# Patient Record
Sex: Female | Born: 1985 | Race: Black or African American | Hispanic: No | Marital: Single | Smoking: Never smoker
Health system: Southern US, Community
[De-identification: ages and names within clinical notes are randomized; demographics above are authoritative.]

## PROBLEM LIST (undated history)

## (undated) DIAGNOSIS — F32A Depression, unspecified: Secondary | ICD-10-CM

## (undated) DIAGNOSIS — F419 Anxiety disorder, unspecified: Secondary | ICD-10-CM

## (undated) DIAGNOSIS — I10 Essential (primary) hypertension: Secondary | ICD-10-CM

## (undated) DIAGNOSIS — I8290 Acute embolism and thrombosis of unspecified vein: Secondary | ICD-10-CM

## (undated) DIAGNOSIS — E669 Obesity, unspecified: Secondary | ICD-10-CM

## (undated) DIAGNOSIS — R7303 Prediabetes: Secondary | ICD-10-CM

## (undated) HISTORY — DX: Essential (primary) hypertension: I10

## (undated) HISTORY — DX: Obesity, unspecified: E66.9

## (undated) HISTORY — DX: Acute embolism and thrombosis of unspecified vein: I82.90

## (undated) HISTORY — DX: Depression, unspecified: F32.A

## (undated) HISTORY — DX: Anxiety disorder, unspecified: F41.9

---

## 2017-06-13 ENCOUNTER — Emergency Department (HOSPITAL_COMMUNITY)
Admission: EM | Admit: 2017-06-13 | Discharge: 2017-06-13 | Disposition: A | Discharge status: Home / Self Care | Payer: Medicaid Other | Attending: Emergency Medicine | Admitting: Emergency Medicine

## 2017-06-13 ENCOUNTER — Other Ambulatory Visit: Payer: Self-pay

## 2017-06-13 ENCOUNTER — Encounter (HOSPITAL_COMMUNITY): Payer: Self-pay

## 2017-06-13 DIAGNOSIS — Z5321 Procedure and treatment not carried out due to patient leaving prior to being seen by health care provider: Secondary | ICD-10-CM | POA: Insufficient documentation

## 2017-06-13 DIAGNOSIS — R200 Anesthesia of skin: Secondary | ICD-10-CM | POA: Insufficient documentation

## 2017-06-13 NOTE — ED Triage Notes (Signed)
Onset yesterday right hand numbness that is constant with intermittant shooting pains up right arm.

## 2017-06-13 NOTE — ED Notes (Signed)
Pt's name called x2 to update vitals with no answer

## 2017-09-17 ENCOUNTER — Emergency Department (HOSPITAL_COMMUNITY)
Admission: EM | Admit: 2017-09-17 | Discharge: 2017-09-17 | Disposition: A | Discharge status: Home / Self Care | Payer: Medicaid Other | Attending: Emergency Medicine | Admitting: Emergency Medicine

## 2017-09-17 ENCOUNTER — Other Ambulatory Visit: Payer: Self-pay

## 2017-09-17 ENCOUNTER — Encounter (HOSPITAL_COMMUNITY): Payer: Self-pay

## 2017-09-17 DIAGNOSIS — Y929 Unspecified place or not applicable: Secondary | ICD-10-CM | POA: Insufficient documentation

## 2017-09-17 DIAGNOSIS — S3141XA Laceration without foreign body of vagina and vulva, initial encounter: Secondary | ICD-10-CM | POA: Diagnosis present

## 2017-09-17 DIAGNOSIS — X58XXXA Exposure to other specified factors, initial encounter: Secondary | ICD-10-CM | POA: Insufficient documentation

## 2017-09-17 DIAGNOSIS — Y939 Activity, unspecified: Secondary | ICD-10-CM | POA: Insufficient documentation

## 2017-09-17 DIAGNOSIS — Y999 Unspecified external cause status: Secondary | ICD-10-CM | POA: Insufficient documentation

## 2017-09-17 LAB — I-STAT BETA HCG BLOOD, ED (MC, WL, AP ONLY)

## 2017-09-17 MED ORDER — LIDOCAINE-EPINEPHRINE (PF) 2 %-1:200000 IJ SOLN
INTRAMUSCULAR | Status: AC
  Administered 2017-09-17: 20 mL
  Filled 2017-09-17: qty 20

## 2017-09-17 MED ORDER — LIDOCAINE-EPINEPHRINE (PF) 2 %-1:200000 IJ SOLN
20.0000 mL | Freq: Once | INTRAMUSCULAR | Status: AC
  Administered 2017-09-17: 20 mL

## 2017-09-17 NOTE — ED Triage Notes (Signed)
States was having intercourse pta to ER and vaginal bleeding noted no pain voiced.

## 2017-09-17 NOTE — ED Provider Notes (Signed)
[facility] DEPT Provider Note   CSN: [id number] Arrival date & time: 09/17/17  0334     History   Chief Complaint Chief Complaint  Patient presents with  . Vaginal Bleeding    HPI Candice Holder is a 32 y.o. female.  HPI Patient presents to the emergency department with complaints of vaginal bleeding that occurred while having intercourse this evening at approximately 3 AM.  She denies significant pain during intercourse.  She denies abdominal pain.  She states the bleeding has since nearly resolved.  Denies nausea vomiting.  No dysuria or urinary frequency.  No other complaints at this time.  Last normal menstrual cycle was 2 weeks ago.  History reviewed. No pertinent past medical history.  There are no active problems to display for this patient.   Past Surgical History:  Procedure Laterality Date  . CESAREAN SECTION       OB History   None      Home Medications    Prior to Admission medications   Not on File    Family History History reviewed. No pertinent family history.  Social History Social History   Tobacco Use  . Smoking status: Never Smoker  . Smokeless tobacco: Never Used  Substance Use Topics  . Alcohol use: Yes  . Drug use: No     Allergies   Iodine   Review of Systems Review of Systems  All other systems reviewed and are negative.    Physical Exam Updated Vital Signs BP (!) 144/94 (BP Location: Right Arm)   Pulse (!) 105   Temp 98.3 F (36.8 C) (Oral)   Resp 20   Ht 5\' 4"  (1.626 m)   Wt 118.8 kg (262 lb)   SpO2 99%   BMI 44.97 kg/m   Physical Exam  Constitutional: She is oriented to person, place, and time. She appears well-developed and well-nourished.  HENT:  Head: Normocephalic.  Eyes: EOM are normal.  Neck: Normal range of motion.  Pulmonary/Chest: Effort normal.  Abdominal: She exhibits no distension.  Genitourinary:  Genitourinary Comments: Chaperone present.  3 cm vaginal  laceration noted to the right anterior lateral vaginal wall without active bleeding  Musculoskeletal: Normal range of motion.  Neurological: She is alert and oriented to person, place, and time.  Psychiatric: She has a normal mood and affect.  Nursing note and vitals reviewed.    ED Treatments / Results  Labs (all labs ordered are listed, but only abnormal results are displayed) Labs Reviewed  I-STAT BETA HCG BLOOD, ED ([facility], [facility], [facility] ONLY)    EKG None  Radiology No results found.  Procedures .Marland KitchenLaceration Repair Performed by: Jola Schmidt, MD Authorized by: Jola Schmidt, MD    LACERATION REPAIR Performed by: Jola Schmidt Consent: Verbal consent obtained. Risks and benefits: risks, benefits and alternatives were discussed Patient identity confirmed: provided demographic data Time out performed prior to procedure Prepped and Draped in normal sterile fashion Wound explored Laceration Location: Right vaginal introitus Laceration Length: 3 cm No Foreign Bodies seen or palpated Anesthesia: local infiltration Local anesthetic: lidocaine 2 % with epinephrine Anesthetic total: 4 ml Irrigation method: syringe Amount of cleaning: standard Skin closure: 3-0 chromic Number of sutures or staples: 3 Technique: Simple interrupted Patient tolerance: Patient tolerated the procedure well with no immediate complications.   Medications Ordered in ED Medications  lidocaine-EPINEPHrine (XYLOCAINE W/EPI) 2 %-1:200000 (PF) injection 20 mL (20 mLs Infiltration Given by Other 09/17/17 5638)     Initial Impression / Assessment  and Plan / ED Course  I have reviewed the triage vital signs and the nursing notes.  Pertinent labs & imaging results that were available during my care of the patient were reviewed by me and considered in my medical decision making (see chart for details).     Vaginal laceration without active bleeding at this time.  Sutured with 3 stitches.  Pelvic rest.  Sitz  bath recommended.  Otherwise overall well-appearing.  Discharged home in good condition.  Abdominal exam is without tenderness.  Final Clinical Impressions(s) / ED Diagnoses   Final diagnoses:  Vaginal laceration, initial encounter    ED Discharge Orders    None       Jola Schmidt, MD 09/17/17 1718

## 2017-11-12 ENCOUNTER — Emergency Department (HOSPITAL_COMMUNITY): Payer: No Typology Code available for payment source

## 2017-11-12 ENCOUNTER — Emergency Department (HOSPITAL_COMMUNITY)
Admission: EM | Admit: 2017-11-12 | Discharge: 2017-11-12 | Disposition: A | Discharge status: Home / Self Care | Payer: No Typology Code available for payment source | Attending: Emergency Medicine | Admitting: Emergency Medicine

## 2017-11-12 ENCOUNTER — Encounter (HOSPITAL_COMMUNITY): Payer: Self-pay | Admitting: Emergency Medicine

## 2017-11-12 ENCOUNTER — Other Ambulatory Visit: Payer: Self-pay

## 2017-11-12 DIAGNOSIS — Y92410 Unspecified street and highway as the place of occurrence of the external cause: Secondary | ICD-10-CM | POA: Diagnosis not present

## 2017-11-12 DIAGNOSIS — F10929 Alcohol use, unspecified with intoxication, unspecified: Secondary | ICD-10-CM | POA: Insufficient documentation

## 2017-11-12 DIAGNOSIS — R4182 Altered mental status, unspecified: Secondary | ICD-10-CM | POA: Diagnosis present

## 2017-11-12 DIAGNOSIS — Y999 Unspecified external cause status: Secondary | ICD-10-CM | POA: Diagnosis not present

## 2017-11-12 DIAGNOSIS — Y9389 Activity, other specified: Secondary | ICD-10-CM | POA: Insufficient documentation

## 2017-11-12 DIAGNOSIS — S27321A Contusion of lung, unilateral, initial encounter: Secondary | ICD-10-CM | POA: Diagnosis not present

## 2017-11-12 LAB — CBC
HCT: 39.6 % (ref 36.0–46.0)
HEMOGLOBIN: 12.9 g/dL (ref 12.0–15.0)
MCH: 29.3 pg (ref 26.0–34.0)
MCHC: 32.6 g/dL (ref 30.0–36.0)
MCV: 90 fL (ref 78.0–100.0)
Platelets: 374 10*3/uL (ref 150–400)
RBC: 4.4 MIL/uL (ref 3.87–5.11)
RDW: 14.1 % (ref 11.5–15.5)
WBC: 8.1 10*3/uL (ref 4.0–10.5)

## 2017-11-12 LAB — COMPREHENSIVE METABOLIC PANEL
ALBUMIN: 3.9 g/dL (ref 3.5–5.0)
ALK PHOS: 50 U/L (ref 38–126)
ALT: 13 U/L — ABNORMAL LOW (ref 14–54)
ANION GAP: 13 (ref 5–15)
AST: 18 U/L (ref 15–41)
BUN: 6 mg/dL (ref 6–20)
CHLORIDE: 111 mmol/L (ref 101–111)
CO2: 19 mmol/L — AB (ref 22–32)
Calcium: 9.1 mg/dL (ref 8.9–10.3)
Creatinine, Ser: 0.68 mg/dL (ref 0.44–1.00)
GFR calc Af Amer: >60 mL/min (ref >60)
GFR calc non Af Amer: >60 mL/min (ref >60)
GLUCOSE: 104 mg/dL — AB (ref 65–99)
Potassium: 3 mmol/L — ABNORMAL LOW (ref 3.5–5.1)
SODIUM: 143 mmol/L (ref 135–145)
Total Bilirubin: 0.4 mg/dL (ref 0.3–1.2)
Total Protein: 6.9 g/dL (ref 6.5–8.1)

## 2017-11-12 LAB — ETHANOL: ALCOHOL ETHYL (B): 265 mg/dL — AB (ref <10)

## 2017-11-12 LAB — I-STAT BETA HCG BLOOD, ED (MC, WL, AP ONLY): I-stat hCG, quantitative: <5 m[IU]/mL (ref <5)

## 2017-11-12 MED ORDER — CYCLOBENZAPRINE HCL 10 MG PO TABS: 10.0000 mg | ORAL_TABLET | Freq: Three times a day (TID) | ORAL | 0 refills | Status: DC | PRN

## 2017-11-12 MED ORDER — IBUPROFEN 800 MG PO TABS: 800.0000 mg | ORAL_TABLET | Freq: Three times a day (TID) | ORAL | 0 refills | Status: DC

## 2017-11-12 NOTE — ED Notes (Signed)
Patient transported to CT 
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
## 2017-11-12 NOTE — ED Notes (Signed)
C-Collar removed per PA Pollina

## 2017-11-12 NOTE — ED Notes (Signed)
Officer Dimas Millin ID # [id number] [place] [employer] here to collect legal blood draw. This RN witnessed pt given her rights at 63. Pt gave consent for blood draw at 0150. Site prepped chlorhexidine solution due to iodine allergy. Kit # [id number], expiration date February 10 2022. Blood drawn at 0159 and samples give to officer Dimas Millin.
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
## 2018-03-26 ENCOUNTER — Ambulatory Visit (INDEPENDENT_AMBULATORY_CARE_PROVIDER_SITE_OTHER): Payer: Self-pay

## 2018-03-26 ENCOUNTER — Ambulatory Visit (HOSPITAL_COMMUNITY)
Admission: EM | Admit: 2018-03-26 | Discharge: 2018-03-26 | Disposition: A | Discharge status: Home / Self Care | Payer: Self-pay | Attending: Family Medicine | Admitting: Family Medicine

## 2018-03-26 ENCOUNTER — Encounter (HOSPITAL_COMMUNITY): Payer: Self-pay | Admitting: Emergency Medicine

## 2018-03-26 DIAGNOSIS — R0789 Other chest pain: Secondary | ICD-10-CM

## 2018-03-26 DIAGNOSIS — R51 Headache: Secondary | ICD-10-CM

## 2018-03-26 MED ORDER — NAPROXEN 500 MG PO TABS: 500.0000 mg | ORAL_TABLET | Freq: Two times a day (BID) | ORAL | 0 refills | Status: DC

## 2018-03-26 NOTE — ED Triage Notes (Signed)
Pt sts bilateral chest and rib pain worse with inspiration

## 2018-03-26 NOTE — Discharge Instructions (Signed)
EKG and Chest Xray normal  Please take naprosyn twice daily with food or Use anti-inflammatories for pain/swelling. You may take up to 800 mg Ibuprofen every 8 hours with food. You may supplement Ibuprofen with Tylenol [id number] mg every 8 hours.   Ice to chest and arm  Follow up if chest pain worsening changing, developing shortness of breath, dizziness, leg pain/swelling, nausea, vomiting,

## 2018-03-26 NOTE — ED Provider Notes (Signed)
[place]    CSN: [id number] Arrival date & time: 03/26/18  1309     History   Chief Complaint Chief Complaint  Patient presents with  . rib pain    HPI Markelle Asaro is a 32 y.o. female no significant past medical history presenting today for evaluation of chest pain, rib pain and left arm pain.  Patient states that on Friday she started to develop chest and rib pain.  Worsens with inspiration.  Feels a pressure sensation as if somebody sitting on top of her chest.  Slight improvement with leaning forward.  Will occasionally get sharp discomfort which started over the weekend and left arm pain that started this morning.  She denies associated cough, fever or shortness of breath.  Denies associated nausea or vomiting.  Eating and drinking like normal.  Seems unrelated to mealtimes.  Denies leg pain or leg swelling.  Patient does note she had a previous blood clot/DVT during pregnancy.  Denies OCP use.  Denies smoking history.  Denies recent travel, immobilization, hospitalization or surgery.  Denies any increase in activity.  Patient does note that she does a lot of lifting at her work as she works for the [employer].  Denies any specific injury, fall or trauma to the side of her chest.  Normal bowel movements.  Denies personal history of hypertension, hyperlipidemia, diabetes.  Patient's mother passed away from MI at 88 and grandmother at 74.  HPI  History reviewed. No pertinent past medical history.  There are no active problems to display for this patient.   Past Surgical History:  Procedure Laterality Date  . CESAREAN SECTION      OB History   None      Home Medications    Prior to Admission medications   Medication Sig Start Date End Date Taking? Authorizing Provider  ibuprofen (ADVIL,MOTRIN) 800 MG tablet Take 1 tablet (800 mg total) by mouth 3 (three) times daily. 11/12/17   Orpah Greek, MD  naproxen (NAPROSYN) 500 MG tablet Take 1 tablet (500  mg total) by mouth 2 (two) times daily. 03/26/18   , Elesa Hacker, PA-C    Family History History reviewed. No pertinent family history.  Social History Social History   Tobacco Use  . Smoking status: Never Smoker  . Smokeless tobacco: Never Used  Substance Use Topics  . Alcohol use: Yes  . Drug use: No     Allergies   Iodine   Review of Systems Review of Systems  Constitutional: Negative for fatigue and fever.  HENT: Negative for congestion, sinus pressure and sore throat.   Eyes: Negative for photophobia, pain and visual disturbance.  Respiratory: Negative for cough and shortness of breath.   Cardiovascular: Positive for chest pain. Negative for leg swelling.  Gastrointestinal: Negative for abdominal pain, nausea and vomiting.  Genitourinary: Negative for decreased urine volume and hematuria.  Musculoskeletal: Positive for back pain and myalgias. Negative for neck pain and neck stiffness.  Neurological: Positive for headaches. Negative for dizziness, syncope, facial asymmetry, speech difficulty, weakness, light-headedness and numbness.     Physical Exam Triage Vital Signs ED Triage Vitals [03/26/18 1400]  Enc Vitals Group     BP (!) 142/88     Pulse Rate (!) 57     Resp 18     Temp 98.4 F (36.9 C)     Temp Source Oral     SpO2 100 %     Weight      Height  Head Circumference      Peak Flow      Pain Score 7     Pain Loc      Pain Edu?      Excl. in [place]?    No data found.  Updated Vital Signs BP (!) 142/88 (BP Location: Left Arm)   Pulse (!) 57   Temp 98.4 F (36.9 C) (Oral)   Resp 18   SpO2 100%   Visual Acuity Right Eye Distance:   Left Eye Distance:   Bilateral Distance:    Right Eye Near:   Left Eye Near:    Bilateral Near:     Physical Exam  Constitutional: She appears well-developed and well-nourished. No distress.  HENT:  Head: Normocephalic and atraumatic.  Oral mucosa pink and moist, no tonsillar enlargement or exudate.  Posterior pharynx patent and nonerythematous, no uvula deviation or swelling. Normal phonation.  Eyes: Pupils are equal, round, and reactive to light. Conjunctivae and EOM are normal.  Neck: Normal range of motion. Neck supple.  Cardiovascular: Normal rate and regular rhythm.  No murmur heard. Pulmonary/Chest: Effort normal and breath sounds normal. No respiratory distress. She exhibits tenderness.  Breathing comfortably at rest, CTABL, no wheezing, rales or other adventitious sounds auscultated  Anterior chest tender to palpation throughout bilateral chest diffusely throughout, tenderness does extend on left side to left flank/mid axillary line into left thoracic back  Abdominal: Soft. There is tenderness.  Mild tenderness throughout entire abdomen, no focal tenderness, negative rebound, negative Murphy's, negative McBurney  Musculoskeletal: She exhibits no edema.  Neurological: She is alert.  Skin: Skin is warm and dry.  Psychiatric: She has a normal mood and affect.  Nursing note and vitals reviewed.    UC Treatments / Results  Labs (all labs ordered are listed, but only abnormal results are displayed) Labs Reviewed - No data to display  EKG None  Radiology Dg Chest 2 View  Result Date: 03/26/2018 CLINICAL DATA:  Chest pain left-sided EXAM: CHEST - 2 VIEW COMPARISON:  CT chest 11/12/2017 FINDINGS: Heart size upper normal. Negative for heart failure or edema. No infiltrate or effusion. Negative for mass lesion. IMPRESSION: No active cardiopulmonary disease. Electronically Signed   By: Franchot Gallo M.D.   On: 03/26/2018 14:35    Procedures Procedures (including critical care time)  Medications Ordered in UC Medications - No data to display  Initial Impression / Assessment and Plan / UC Course  I have reviewed the triage vital signs and the nursing notes.  Pertinent labs & imaging results that were available during my care of the patient were reviewed by me and considered  in my medical decision making (see chart for details).     Chest x-ray negative, EKG normal sinus rhythm, no acute signs of ischemia or infarction.  Given chest discomfort reproducible, likely musculoskeletal especially given nature of job.  Naprosyn twice daily for discomfort.  Discussed with patient that EKG does not definitively rule out cardiac origin of chest discomfort given her family history, but overall negative risk factors.  Patient is not tachycardic, short of breath, or 100%, PE less likely.  Advised patient if she has worsening chest discomfort, developing shortness of breath, dizziness, to go to emergency room for further work-up of her symptoms.Discussed strict return precautions. Patient verbalized understanding and is agreeable with plan.  Final Clinical Impressions(s) / UC Diagnoses   Final diagnoses:  Chest wall pain     Discharge Instructions     EKG and Chest  Xray normal  Please take naprosyn twice daily with food or Use anti-inflammatories for pain/swelling. You may take up to 800 mg Ibuprofen every 8 hours with food. You may supplement Ibuprofen with Tylenol [phone] mg every 8 hours.   Ice to chest and arm  Follow up if chest pain worsening changing, developing shortness of breath, dizziness, leg pain/swelling, nausea, vomiting,     ED Prescriptions    Medication Sig Dispense Auth. Provider   naproxen (NAPROSYN) 500 MG tablet Take 1 tablet (500 mg total) by mouth 2 (two) times daily. 30 tablet , [place] C, PA-C     Controlled Substance Prescriptions [place] Controlled Substance Registry consulted? Not Applicable   Janith Lima, [place] 03/26/18 1458

## 2018-05-13 ENCOUNTER — Other Ambulatory Visit: Payer: Self-pay

## 2018-05-13 ENCOUNTER — Ambulatory Visit (HOSPITAL_COMMUNITY)
Admission: EM | Admit: 2018-05-13 | Discharge: 2018-05-13 | Disposition: A | Discharge status: Home / Self Care | Payer: Self-pay | Attending: Family Medicine | Admitting: Family Medicine

## 2018-05-13 ENCOUNTER — Encounter (HOSPITAL_COMMUNITY): Payer: Self-pay | Admitting: Emergency Medicine

## 2018-05-13 ENCOUNTER — Ambulatory Visit (INDEPENDENT_AMBULATORY_CARE_PROVIDER_SITE_OTHER): Payer: Self-pay

## 2018-05-13 DIAGNOSIS — S20219A Contusion of unspecified front wall of thorax, initial encounter: Secondary | ICD-10-CM

## 2018-05-13 MED ORDER — CYCLOBENZAPRINE HCL 10 MG PO TABS: 10.0000 mg | ORAL_TABLET | Freq: Two times a day (BID) | ORAL | 0 refills | Status: DC | PRN

## 2018-05-13 MED ORDER — IBUPROFEN 800 MG PO TABS: 800.0000 mg | ORAL_TABLET | Freq: Three times a day (TID) | ORAL | 0 refills | Status: DC

## 2018-05-13 NOTE — ED Triage Notes (Signed)
The patient presented to the [facility] with a complaint of sternal and left side rib cage pain secondary to a fall that occurred last night. The patient stated that she missed a step and fell.
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
## 2018-05-13 NOTE — Discharge Instructions (Signed)
NO fracture seen in chest  Use anti-inflammatories for pain/swelling. You may take up to 800 mg Ibuprofen every 8 hours with food. You may supplement Ibuprofen with Tylenol [phone] every 8 hours.   You may use flexeril as needed to help with pain. This is a muscle relaxer and causes sedation- please use only at bedtime or when you will be home and not have to drive/work  Please continue to monitor your symptoms and follow up if symptoms not gradually resolving over next 1-2 weeks, return sooner if developing worsening shortness of breath, different chest pain, changes in vision, numbness or tingling, weakness

## 2018-10-09 ENCOUNTER — Emergency Department (HOSPITAL_COMMUNITY)
Admission: EM | Admit: 2018-10-09 | Discharge: 2018-10-09 | Disposition: A | Discharge status: Home / Self Care | Payer: Self-pay | Attending: Emergency Medicine | Admitting: Emergency Medicine

## 2018-10-09 ENCOUNTER — Encounter (HOSPITAL_COMMUNITY): Payer: Self-pay | Admitting: *Deleted

## 2018-10-09 DIAGNOSIS — Y939 Activity, unspecified: Secondary | ICD-10-CM | POA: Insufficient documentation

## 2018-10-09 DIAGNOSIS — X58XXXA Exposure to other specified factors, initial encounter: Secondary | ICD-10-CM | POA: Insufficient documentation

## 2018-10-09 DIAGNOSIS — Y999 Unspecified external cause status: Secondary | ICD-10-CM | POA: Insufficient documentation

## 2018-10-09 DIAGNOSIS — Y929 Unspecified place or not applicable: Secondary | ICD-10-CM | POA: Insufficient documentation

## 2018-10-09 DIAGNOSIS — T192XXA Foreign body in vulva and vagina, initial encounter: Secondary | ICD-10-CM | POA: Insufficient documentation

## 2018-10-09 DIAGNOSIS — T7421XA Adult sexual abuse, confirmed, initial encounter: Secondary | ICD-10-CM | POA: Insufficient documentation

## 2018-10-09 LAB — CBC WITH DIFFERENTIAL/PLATELET
Abs Immature Granulocytes: 0.03 10*3/uL (ref 0.00–0.07)
Basophils Absolute: 0.1 10*3/uL (ref 0.0–0.1)
Basophils Relative: 1 %
Eosinophils Absolute: 0.1 10*3/uL (ref 0.0–0.5)
Eosinophils Relative: 1 %
HCT: 37.6 % (ref 36.0–46.0)
Hemoglobin: 12.4 g/dL (ref 12.0–15.0)
Immature Granulocytes: 0 %
Lymphocytes Relative: 28 %
Lymphs Abs: 2.5 10*3/uL (ref 0.7–4.0)
MCH: 30.4 pg (ref 26.0–34.0)
MCHC: 33 g/dL (ref 30.0–36.0)
MCV: 92.2 fL (ref 80.0–100.0)
Monocytes Absolute: 0.4 10*3/uL (ref 0.1–1.0)
Monocytes Relative: 5 %
Neutro Abs: 6 10*3/uL (ref 1.7–7.7)
Neutrophils Relative %: 65 %
Platelets: 463 10*3/uL — ABNORMAL HIGH (ref 150–400)
RBC: 4.08 MIL/uL (ref 3.87–5.11)
RDW: 13.2 % (ref 11.5–15.5)
WBC: 9.1 10*3/uL (ref 4.0–10.5)
nRBC: 0 % (ref 0.0–0.2)

## 2018-10-09 LAB — URINALYSIS, ROUTINE W REFLEX MICROSCOPIC
Bacteria, UA: NONE SEEN
Bilirubin Urine: NEGATIVE
Glucose, UA: NEGATIVE mg/dL
Ketones, ur: NEGATIVE mg/dL
Nitrite: NEGATIVE
Protein, ur: NEGATIVE mg/dL
Specific Gravity, Urine: 1.016 (ref 1.005–1.030)
pH: 6 (ref 5.0–8.0)

## 2018-10-09 LAB — BASIC METABOLIC PANEL
Anion gap: 10 (ref 5–15)
BUN: 9 mg/dL (ref 6–20)
CO2: 24 mmol/L (ref 22–32)
Calcium: 9.4 mg/dL (ref 8.9–10.3)
Chloride: 106 mmol/L (ref 98–111)
Creatinine, Ser: 0.65 mg/dL (ref 0.44–1.00)
GFR calc Af Amer: >60 mL/min (ref >60)
GFR calc non Af Amer: >60 mL/min (ref >60)
Glucose, Bld: 104 mg/dL — ABNORMAL HIGH (ref 70–99)
Potassium: 3.7 mmol/L (ref 3.5–5.1)
Sodium: 140 mmol/L (ref 135–145)

## 2018-10-09 LAB — POC URINE PREG, ED: Preg Test, Ur: NEGATIVE

## 2018-10-09 LAB — WET PREP, GENITAL
Sperm: NONE SEEN
Trich, Wet Prep: NONE SEEN
Yeast Wet Prep HPF POC: NONE SEEN

## 2018-10-09 MED ORDER — METRONIDAZOLE 500 MG PO TABS
2000.0000 mg | ORAL_TABLET | Freq: Once | ORAL | Status: AC
  Administered 2018-10-09: 2000 mg via ORAL
  Filled 2018-10-09: qty 4

## 2018-10-09 MED ORDER — LIDOCAINE HCL (PF) 1 % IJ SOLN: 0.9000 mL | Freq: Once | INTRAMUSCULAR | Status: DC

## 2018-10-09 MED ORDER — AZITHROMYCIN 250 MG PO TABS
1000.0000 mg | ORAL_TABLET | Freq: Once | ORAL | Status: AC
  Administered 2018-10-09: 1000 mg via ORAL
  Filled 2018-10-09: qty 4

## 2018-10-09 MED ORDER — CEFTRIAXONE SODIUM 250 MG IJ SOLR
250.0000 mg | Freq: Once | INTRAMUSCULAR | Status: AC
  Administered 2018-10-09: 250 mg via INTRAMUSCULAR
  Filled 2018-10-09: qty 250

## 2018-10-09 MED ORDER — ULIPRISTAL ACETATE 30 MG PO TABS
30.0000 mg | ORAL_TABLET | Freq: Once | ORAL | Status: AC
  Administered 2018-10-09: 30 mg via ORAL
  Filled 2018-10-09: qty 1

## 2018-10-09 NOTE — Discharge Instructions (Addendum)
Please read attached information. If you experience any new or worsening signs or symptoms please return to the emergency room for evaluation. Please follow-up with your primary care provider or specialist as discussed.  °
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
## 2018-10-09 NOTE — ED Notes (Signed)
Patient verbalizes understanding of discharge instructions. Opportunity for questioning and answers were provided. Armband removed by staff, pt discharged from ED. Ambulated out to lobby  
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
## 2018-10-10 LAB — HIV ANTIBODY (ROUTINE TESTING W REFLEX): HIV Screen 4th Generation wRfx: NONREACTIVE

## 2018-10-10 LAB — GC/CHLAMYDIA PROBE AMP (~~LOC~~) NOT AT ARMC
Chlamydia: NEGATIVE
Neisseria Gonorrhea: NEGATIVE

## 2019-10-06 ENCOUNTER — Emergency Department (HOSPITAL_COMMUNITY): Payer: Medicaid Other

## 2019-10-06 ENCOUNTER — Emergency Department (HOSPITAL_COMMUNITY)
Admission: EM | Admit: 2019-10-06 | Discharge: 2019-10-06 | Disposition: A | Discharge status: Home / Self Care | Payer: Medicaid Other | Attending: Emergency Medicine | Admitting: Emergency Medicine

## 2019-10-06 ENCOUNTER — Other Ambulatory Visit: Payer: Self-pay

## 2019-10-06 DIAGNOSIS — T148XXA Other injury of unspecified body region, initial encounter: Secondary | ICD-10-CM

## 2019-10-06 DIAGNOSIS — Z23 Encounter for immunization: Secondary | ICD-10-CM | POA: Insufficient documentation

## 2019-10-06 DIAGNOSIS — X58XXXA Exposure to other specified factors, initial encounter: Secondary | ICD-10-CM | POA: Insufficient documentation

## 2019-10-06 DIAGNOSIS — S61301A Unspecified open wound of left index finger with damage to nail, initial encounter: Secondary | ICD-10-CM | POA: Diagnosis not present

## 2019-10-06 DIAGNOSIS — Y990 Civilian activity done for income or pay: Secondary | ICD-10-CM | POA: Insufficient documentation

## 2019-10-06 DIAGNOSIS — S6992XA Unspecified injury of left wrist, hand and finger(s), initial encounter: Secondary | ICD-10-CM | POA: Diagnosis present

## 2019-10-06 DIAGNOSIS — Y9289 Other specified places as the place of occurrence of the external cause: Secondary | ICD-10-CM | POA: Insufficient documentation

## 2019-10-06 DIAGNOSIS — Y939 Activity, unspecified: Secondary | ICD-10-CM | POA: Diagnosis not present

## 2019-10-06 MED ORDER — ACETAMINOPHEN 325 MG PO TABS
650.0000 mg | ORAL_TABLET | Freq: Once | ORAL | Status: AC
  Administered 2019-10-06: 650 mg via ORAL
  Filled 2019-10-06: qty 2

## 2019-10-06 MED ORDER — TETANUS-DIPHTH-ACELL PERTUSSIS 5-2.5-18.5 LF-MCG/0.5 IM SUSP
0.5000 mL | Freq: Once | INTRAMUSCULAR | Status: AC
  Administered 2019-10-06: 0.5 mL via INTRAMUSCULAR
  Filled 2019-10-06: qty 0.5

## 2019-10-06 NOTE — ED Provider Notes (Signed)
[facility] EMERGENCY DEPARTMENT Provider Note   CSN: [id number] Arrival date & time: 10/06/19  1907     History Chief Complaint  Patient presents with  . Nail Problem    Candice Holder is a 34 y.o. female with past medical history who presents for evaluation of finger injury.  Patient states that she works at work in [employer] when she went to stop a box on the conveyor belt that had too much female this broke her artificial fingernail off of her real fingernail.  This occurred on her second digit of her left upper extremity.  She is unsure of her last tetanus.  States she has had some bleeding.  She is allergic to iodine.  Denies any bony tenderness, no fever, chills, nausea, vomiting, decreased range of motion, numbness or tingling, redness, swelling, warmth, purulent drainage.  Denies additional aggravating or alleviating factors.  Has not take anything for pain.  Pain rated an 8/10.  History obtained from patient and past medical records.  No interpreter is used.  HPI     No past medical history on file.  There are no problems to display for this patient.   Past Surgical History:  Procedure Laterality Date  . CESAREAN SECTION       OB History   No obstetric history on file.     No family history on file.  Social History   Tobacco Use  . Smoking status: Never Smoker  . Smokeless tobacco: Never Used  Substance Use Topics  . Alcohol use: Yes    Comment: social  . Drug use: No    Home Medications Prior to Admission medications   Medication Sig Start Date End Date Taking? Authorizing Provider  cyclobenzaprine (FLEXERIL) 10 MG tablet Take 1 tablet (10 mg total) by mouth 2 (two) times daily as needed for muscle spasms. 05/13/18   Wieters, Hallie C, PA-C  ibuprofen (ADVIL,MOTRIN) 800 MG tablet Take 1 tablet (800 mg total) by mouth 3 (three) times daily. 05/13/18   Wieters, Hallie C, PA-C    Allergies    Iodine  Review of Systems   Review of  Systems  Constitutional: Negative.   HENT: Negative.   Respiratory: Negative.   Cardiovascular: Negative.   Gastrointestinal: Negative.   Genitourinary: Negative.   Musculoskeletal:       Finger injury to LUE  Skin: Positive for wound.  Neurological: Negative.   All other systems reviewed and are negative.   Physical Exam Updated Vital Signs BP (!) 144/115 (BP Location: Right Arm)   Pulse 73   Temp 98 F (36.7 C) (Oral)   Resp 18   SpO2 100%   Physical Exam Vitals and nursing note reviewed.  Constitutional:      General: She is not in acute distress.    Appearance: She is well-developed. She is not ill-appearing, toxic-appearing or diaphoretic.  HENT:     Head: Normocephalic and atraumatic.     Nose: Nose normal.     Mouth/Throat:     Mouth: Mucous membranes are moist.     Pharynx: Oropharynx is clear.  Eyes:     Pupils: Pupils are equal, round, and reactive to light.  Cardiovascular:     Rate and Rhythm: Normal rate.     Pulses: Normal pulses.     Heart sounds: Normal heart sounds.  Pulmonary:     Effort: Pulmonary effort is normal. No respiratory distress.     Breath sounds: Normal breath sounds.  Abdominal:     General: Bowel sounds are normal. There is no distension.  Musculoskeletal:        General: Normal range of motion.     Cervical back: Normal range of motion.     Comments: No bony tenderness.  No obvious bony deformity, edema.  Moves all 4 extremities without difficulty.  Intact radial ulnar sensation to left upper extremity, second digit.  Skin:    General: Skin is warm and dry.     Capillary Refill: Capillary refill takes less than 2 seconds.     Comments: Facial nail removed from second digit to left upper extremity prior to arrival.  Nailbed intact to proximal and lateral nail folds.  She does have mild skin tear/dermal avulsion to distal aspect of second digit.  No subungual hematoma.  Nailbed appears firmly attached to underlying dermis.    Neurological:     General: No focal deficit present.     Mental Status: She is alert and oriented to person, place, and time.     Sensory: No sensory deficit.     Motor: No weakness.         ED Results / Procedures / Treatments   Labs (all labs ordered are listed, but only abnormal results are displayed) Labs Reviewed - No data to display  EKG None  Radiology DG Finger Index Left  Result Date: 10/06/2019 CLINICAL DATA:  35 year old female with acute LEFT index finger injury and pain. Initial encounter. EXAM: LEFT INDEX FINGER 2+V COMPARISON:  None. FINDINGS: There is no evidence of fracture or dislocation. There is no evidence of arthropathy or other focal bone abnormality. Soft tissues are unremarkable. IMPRESSION: Negative. Electronically Signed   By: Margarette Canada M.D.   On: 10/06/2019 19:57   Procedures Wound closure utilizing adhes only  Date/Time: 10/06/2019 9:50 PM Performed by: Nettie Elm, PA-C Authorized by: Nettie Elm, PA-C  Consent: Verbal consent obtained. Written consent not obtained. Risks and benefits: risks, benefits and alternatives were discussed Consent given by: patient Patient understanding: patient states understanding of the procedure being performed Patient consent: the patient's understanding of the procedure matches consent given Procedure consent: procedure consent matches procedure scheduled Relevant documents: relevant documents present and verified Test results: test results available and properly labeled Site marked: the operative site was marked Imaging studies: imaging studies available Required items: required blood products, implants, devices, and special equipment available Patient identity confirmed: verbally with patient Time out: Immediately prior to procedure a "time out" was called to verify the correct patient, procedure, equipment, support staff and site/side marked as required. Preparation: Patient was prepped and  draped in the usual sterile fashion. Local anesthesia used: no  Anesthesia: Local anesthesia used: no  Sedation: Patient sedated: no  Patient tolerance: patient tolerated the procedure well with no immediate complications Comments: Dermabond applied    (including critical care time)  Medications Ordered in ED Medications  Tdap (BOOSTRIX) injection 0.5 mL (0.5 mLs Intramuscular Given 10/06/19 2134)  acetaminophen (TYLENOL) tablet 650 mg (650 mg Oral Given 10/06/19 2133)   ED Course  I have reviewed the triage vital signs and the nursing notes.  Pertinent labs & imaging results that were available during my care of the patient were reviewed by me and considered in my medical decision making (see chart for details).  34 year old female appears otherwise well presents for evaluation of hand injury.  She has a normal musculoskeletal exam.  She is neurovascularly intact.  She has no systemic  symptoms.  Her nailbed does not appear to have any injury, nail is firmly intact to proximal lateral nail fold.  No evidence of subungual hematoma.  She does have skin tear/superficial dermal avulsion to distal aspect second digit to left upper extremity.  Plain film x-ray personally reviewed interpreted which does not show any evidence of fracture, dislocation.  Will update her tetanus as well as irrigate wound.  No lacerations to suture however will do wound care.   Do not feel needs antibiotics at this time.  Follow-up outpatient with PCP.  Discussed wound care.  The patient has been appropriately medically screened and/or stabilized in the ED. I have low suspicion for any other emergent medical condition which would require further screening, evaluation or treatment in the ED or require inpatient management.  Patient is hemodynamically stable and in no acute distress.  Patient able to ambulate in department prior to ED.  Evaluation does not show acute pathology that would require ongoing or additional  emergent interventions while in the emergency department or further inpatient treatment.  I have discussed the diagnosis with the patient and answered all questions.  Pain is been managed while in the emergency department and patient has no further complaints prior to discharge.  Patient is comfortable with plan discussed in room and is stable for discharge at this time.  I have discussed strict return precautions for returning to the emergency department.  Patient was encouraged to follow-up with PCP/specialist refer to at discharge.    MDM Rules/Calculators/A&P                      Final Clinical Impression(s) / ED Diagnoses Final diagnoses:  Injury of finger of left hand, initial encounter  Skin avulsion    Rx / DC Orders ED Discharge Orders    None       Maverick Dieudonne A, PA-C 10/06/19 2151    Elnora Morrison, MD 10/06/19 2359

## 2019-10-06 NOTE — Discharge Instructions (Addendum)
Let warm soapy water run over the wound.  Your nail will gradually grow back in.  The Dermabond will fall off on its own.  Do not pick.  If you notice any redness, swelling, persistent bleeding to wound please seek reevaluation.  If you notice bleeding just hold pressure for 1 hour.  Return for reevaluation if you notice bleeding has not stopped after 1 hour pressure.

## 2019-10-06 NOTE — ED Triage Notes (Signed)
Patient broke 2nd digit, left hand, fingernail (artificial) while stopping a box on a conveyer belt.

## 2020-02-18 ENCOUNTER — Encounter: Payer: Self-pay | Admitting: *Deleted

## 2020-02-25 ENCOUNTER — Encounter: Payer: Medicaid Other | Admitting: Certified Nurse Midwife

## 2020-05-19 ENCOUNTER — Inpatient Hospital Stay (HOSPITAL_COMMUNITY): Payer: Medicaid Other

## 2020-05-19 ENCOUNTER — Encounter (HOSPITAL_COMMUNITY): Payer: Self-pay | Admitting: Obstetrics & Gynecology

## 2020-05-19 ENCOUNTER — Ambulatory Visit (HOSPITAL_COMMUNITY)
Admission: EM | Admit: 2020-05-19 | Discharge: 2020-05-19 | Disposition: A | Discharge status: Home / Self Care | Payer: Medicaid Other | Attending: Emergency Medicine | Admitting: Emergency Medicine

## 2020-05-19 ENCOUNTER — Other Ambulatory Visit: Payer: Self-pay

## 2020-05-19 ENCOUNTER — Inpatient Hospital Stay (HOSPITAL_COMMUNITY)
Admission: AD | Admit: 2020-05-19 | Discharge: 2020-05-19 | Disposition: A | Discharge status: Home / Self Care | Payer: Medicaid Other | Attending: Obstetrics & Gynecology | Admitting: Obstetrics & Gynecology

## 2020-05-19 DIAGNOSIS — O23591 Infection of other part of genital tract in pregnancy, first trimester: Secondary | ICD-10-CM | POA: Insufficient documentation

## 2020-05-19 DIAGNOSIS — Z3A01 Less than 8 weeks gestation of pregnancy: Secondary | ICD-10-CM | POA: Diagnosis not present

## 2020-05-19 DIAGNOSIS — N760 Acute vaginitis: Secondary | ICD-10-CM

## 2020-05-19 DIAGNOSIS — Z3201 Encounter for pregnancy test, result positive: Secondary | ICD-10-CM

## 2020-05-19 DIAGNOSIS — Z791 Long term (current) use of non-steroidal anti-inflammatories (NSAID): Secondary | ICD-10-CM | POA: Diagnosis not present

## 2020-05-19 DIAGNOSIS — O26891 Other specified pregnancy related conditions, first trimester: Secondary | ICD-10-CM | POA: Insufficient documentation

## 2020-05-19 DIAGNOSIS — B9689 Other specified bacterial agents as the cause of diseases classified elsewhere: Secondary | ICD-10-CM | POA: Insufficient documentation

## 2020-05-19 DIAGNOSIS — Z3491 Encounter for supervision of normal pregnancy, unspecified, first trimester: Secondary | ICD-10-CM

## 2020-05-19 DIAGNOSIS — Z888 Allergy status to other drugs, medicaments and biological substances status: Secondary | ICD-10-CM | POA: Diagnosis not present

## 2020-05-19 DIAGNOSIS — R109 Unspecified abdominal pain: Secondary | ICD-10-CM | POA: Insufficient documentation

## 2020-05-19 LAB — CBC
HCT: 35.2 % — ABNORMAL LOW (ref 36.0–46.0)
Hemoglobin: 12.1 g/dL (ref 12.0–15.0)
MCH: 30.9 pg (ref 26.0–34.0)
MCHC: 34.4 g/dL (ref 30.0–36.0)
MCV: 89.8 fL (ref 80.0–100.0)
Platelets: 442 10*3/uL — ABNORMAL HIGH (ref 150–400)
RBC: 3.92 MIL/uL (ref 3.87–5.11)
RDW: 13.4 % (ref 11.5–15.5)
WBC: 9.2 10*3/uL (ref 4.0–10.5)
nRBC: 0 % (ref 0.0–0.2)

## 2020-05-19 LAB — URINALYSIS, ROUTINE W REFLEX MICROSCOPIC
Bilirubin Urine: NEGATIVE
Glucose, UA: NEGATIVE mg/dL
Hgb urine dipstick: NEGATIVE
Ketones, ur: 20 mg/dL — AB
Leukocytes,Ua: NEGATIVE
Nitrite: NEGATIVE
Protein, ur: NEGATIVE mg/dL
Specific Gravity, Urine: 1.02 (ref 1.005–1.030)
pH: 5 (ref 5.0–8.0)

## 2020-05-19 LAB — WET PREP, GENITAL
Sperm: NONE SEEN
Trich, Wet Prep: NONE SEEN
WBC, Wet Prep HPF POC: NONE SEEN
Yeast Wet Prep HPF POC: NONE SEEN

## 2020-05-19 LAB — POC URINE PREG, ED: Preg Test, Ur: POSITIVE — AB

## 2020-05-19 LAB — ABO/RH: ABO/RH(D): O POS

## 2020-05-19 LAB — HCG, QUANTITATIVE, PREGNANCY: hCG, Beta Chain, Quant, S: 45307 m[IU]/mL — ABNORMAL HIGH (ref <5)

## 2020-05-19 MED ORDER — METRONIDAZOLE 500 MG PO TABS: 500.0000 mg | ORAL_TABLET | Freq: Two times a day (BID) | ORAL | 0 refills | Status: DC

## 2020-05-19 NOTE — ED Notes (Signed)
Per Rondel Oh, NP pt advised to go to MAU for eval of pregnancy in the presence of abdominal pain

## 2020-05-19 NOTE — ED Notes (Signed)
Pt states abdominal cramping, preg positive.

## 2020-05-19 NOTE — MAU Note (Signed)
Works in [employer], last night she was working and she kept cramping, felt like menstrual cramps, more on the right side.Marland Kitchen Hx of miscarriage. No bleeding. Wanted to make sure the baby was ok

## 2020-05-19 NOTE — MAU Provider Note (Signed)
History     CSN: [id number]  Arrival date and time: 05/19/20 1757    Chief Complaint  Patient presents with  . Abdominal Pain   Candice Holder is a 34 y.o. G2P0 at [redacted]w[redacted]d by LMP who presents to MAU with complaints of abdominal pain. 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 Started having cramping once she got home from work, laid down and rested, this morning continued to have cramping. She describes the pain as lower abdominal cramping that is intermittent. Rates pain 3/10- has not taken any medication for pain. She denies vaginal bleeding or discharge. She denies urinary symptoms or any other complaints.   OB History    Gravida  1   Para      Term      Preterm      AB      Living        SAB      TAB      Ectopic      Multiple      Live Births              No past medical history on file.  Past Surgical History:  Procedure Laterality Date  . CESAREAN SECTION      No family history on file.  Social History   Tobacco Use  . Smoking status: Never Smoker  . Smokeless tobacco: Never Used  Substance Use Topics  . Alcohol use: Yes    Comment: social  . Drug use: No    Allergies:  Allergies  Allergen Reactions  . Iodine Hives    Medications Prior to Admission  Medication Sig Dispense Refill Last Dose  . cyclobenzaprine (FLEXERIL) 10 MG tablet Take 1 tablet (10 mg total) by mouth 2 (two) times daily as needed for muscle spasms. 20 tablet 0   . ibuprofen (ADVIL,MOTRIN) 800 MG tablet Take 1 tablet (800 mg total) by mouth 3 (three) times daily. 21 tablet 0     Review of Systems  Constitutional: Negative.   Respiratory: Negative.   Cardiovascular: Negative.   Gastrointestinal: Positive for abdominal pain. Negative for constipation, diarrhea, nausea and vomiting.  Genitourinary: Negative.   Musculoskeletal: Negative.   Neurological: Negative.   Psychiatric/Behavioral: Negative.     Physical Exam   Blood pressure 133/76, pulse 76, temperature 98.6 F (37 C), temperature source Oral, resp. rate 18, height 5' 3.5" (1.613 m), weight 129.5 kg, last menstrual period 03/31/2020, SpO2 98 %.  Physical Exam Vitals and nursing note reviewed.  HENT:     Head: Normocephalic.  Cardiovascular:     Rate and Rhythm: Normal rate and regular rhythm.  Pulmonary:     Effort: Pulmonary effort is normal. No respiratory distress.     Breath sounds: Normal breath sounds. No wheezing.  Abdominal:     General: There is no distension.     Palpations: Abdomen is soft. There is no mass.     Tenderness: There is no abdominal tenderness. There is no guarding.  Skin:    General: Skin is dry.  Neurological:     Mental Status: She is alert and oriented to person, place, and time.  Psychiatric:        Mood and Affect: Mood normal.        Behavior: Behavior normal.        Thought Content: Thought content normal.     MAU Course  Procedures  MDM Orders  Placed This Encounter  Procedures  . Wet prep, genital  . US OB LESS THAN 14 WEEKS WITH OB TRANSVAGINAL  . US OB Transvaginal  . Urinalysis, Routine w reflex microscopic Urine, Clean Catch  . CBC  . hCG, quantitative, pregnancy  . ABO/Rh   Labs and [place] report reviewed:  Results for orders placed or performed during the hospital encounter of 05/19/20 (from the past 24 hour(s))  Urinalysis, Routine w reflex microscopic Urine, Clean Catch     Status: Abnormal   Collection Time: 05/19/20  6:49 PM  Result Value Ref Range   Color, Urine YELLOW YELLOW   APPearance HAZY (A) CLEAR   Specific Gravity, Urine 1.020 1.005 - 1.030   pH 5.0 5.0 - 8.0   Glucose, UA NEGATIVE NEGATIVE mg/dL   Hgb urine dipstick NEGATIVE NEGATIVE   Bilirubin Urine NEGATIVE NEGATIVE   Ketones, ur 20 (A) NEGATIVE mg/dL   Protein, ur NEGATIVE NEGATIVE mg/dL   Nitrite NEGATIVE NEGATIVE   Leukocytes,Ua NEGATIVE NEGATIVE  CBC     Status: Abnormal   Collection  Time: 05/19/20  9:10 PM  Result Value Ref Range   WBC 9.2 4.0 - 10.5 K/uL   RBC 3.92 3.87 - 5.11 MIL/uL   Hemoglobin 12.1 12.0 - 15.0 g/dL   HCT 35.2 (L) 36 - 46 %   MCV 89.8 80.0 - 100.0 fL   MCH 30.9 26.0 - 34.0 pg   MCHC 34.4 30.0 - 36.0 g/dL   RDW 13.4 11.5 - 15.5 %   Platelets 442 (H) 150 - 400 K/uL   nRBC 0.0 0.0 - 0.2 %  ABO/Rh     Status: None   Collection Time: 05/19/20  9:10 PM  Result Value Ref Range   ABO/RH(D) O POS    No rh immune globuloin      NOT A RH IMMUNE GLOBULIN CANDIDATE, PT RH POSITIVE Performed at [facility], [address], [place], [place] [address]   hCG, quantitative, pregnancy     Status: Abnormal   Collection Time: 05/19/20  9:10 PM  Result Value Ref Range   hCG, Beta Chain, Quant, S 45,307 (H) <5 mIU/mL  Wet prep, genital     Status: Abnormal   Collection Time: 05/19/20 10:45 PM   Specimen: Vaginal  Result Value Ref Range   Yeast Wet Prep HPF POC NONE SEEN NONE SEEN   Trich, Wet Prep NONE SEEN NONE SEEN   Clue Cells Wet Prep HPF POC PRESENT (A) NONE SEEN   WBC, Wet Prep HPF POC NONE SEEN NONE SEEN   Sperm NONE SEEN    US OB LESS THAN 14 WEEKS WITH OB TRANSVAGINAL  Result Date: 05/19/2020 CLINICAL DATA:  Cramping EXAM: OBSTETRIC <14 WK [place] AND TRANSVAGINAL OB US TECHNIQUE: Both transabdominal and transvaginal ultrasound examinations were performed for complete evaluation of the gestation as well as the maternal uterus, adnexal regions, and pelvic cul-de-sac. Transvaginal technique was performed to assess early pregnancy. COMPARISON:  None. FINDINGS: Intrauterine gestational sac: Single intrauterine gestational sac Yolk sac:  Probable yolk sac visualized Embryo:  Not visualized MSD: 15.7 mm   6 w   2 d Subchorionic hemorrhage:  None visualized. Maternal uterus/adnexae: Ovaries are within normal limits. Left ovary measures 1.5 x 2.7 x 1.8 cm. Right ovary measures 2.7 x 4 x 2.7 cm. No significant free fluid IMPRESSION: Single IUP with visible  gestational sac and probable yolk sac but no embryo. Consider follow-up sonography in 10-14 days to confirm viability.  Otherwise negative examination Electronically Signed   By: Donavan Foil M.D.   On: 05/19/2020 22:21    Discussed results of ultrasound and labs with patient. GS and probaby yolk sac seen on [place] today - discussed with patient we will obtain another [place] in 10-14 days for viability. Order for outpatient [place] placed.  +clue cells on wet prep, patient denies vaginal discharge. Due to patient having abdominal cramping will order Flagyl for treatment of BV  Educated and discussed pregnancy work restrictions and not lifting anything heavier than 15-20lbs - work note given to patient.   Discussed reasons to return to MAU. Encouraged to make initial prenatal appointment. Return to MAU as needed. Pt stable at time of discharge.   Assessment and Plan   1. Normal IUP (intrauterine pregnancy) on prenatal ultrasound, first trimester   2. Abdominal pain during pregnancy in first trimester   3. Bacterial vaginosis    Discharge home Encouraged to make initial prenatal appointment   Follow up US ordered to assess viability in 10-14 days  Return to MAU as needed for reasons discussed and/or emergencies     Allergies as of 05/19/2020      Reactions   Iodine Hives      Medication List    STOP taking these medications   ibuprofen 800 MG tablet Commonly known as: ADVIL     TAKE these medications   cyclobenzaprine 10 MG tablet Commonly known as: FLEXERIL Take 1 tablet (10 mg total) by mouth 2 (two) times daily as needed for muscle spasms.   metroNIDAZOLE 500 MG tablet Commonly known as: FLAGYL Take 1 tablet (500 mg total) by mouth 2 (two) times daily.       Lajean Manes CNM 05/19/2020, 11:20 PM

## 2020-05-20 ENCOUNTER — Encounter (HOSPITAL_COMMUNITY): Payer: Self-pay | Admitting: Obstetrics & Gynecology

## 2020-05-21 LAB — GC/CHLAMYDIA PROBE AMP (~~LOC~~) NOT AT ARMC
Chlamydia: NEGATIVE
Comment: NEGATIVE
Comment: NORMAL
Neisseria Gonorrhea: NEGATIVE

## 2020-05-26 DIAGNOSIS — F603 Borderline personality disorder: Secondary | ICD-10-CM | POA: Insufficient documentation

## 2020-05-26 DIAGNOSIS — I10 Essential (primary) hypertension: Secondary | ICD-10-CM | POA: Insufficient documentation

## 2020-05-28 ENCOUNTER — Other Ambulatory Visit: Payer: Self-pay

## 2020-05-28 ENCOUNTER — Inpatient Hospital Stay (HOSPITAL_COMMUNITY)
Admission: AD | Admit: 2020-05-28 | Discharge: 2020-05-28 | Disposition: A | Discharge status: Home / Self Care | Payer: Medicaid Other | Attending: Obstetrics and Gynecology | Admitting: Obstetrics and Gynecology

## 2020-05-28 ENCOUNTER — Ambulatory Visit: Payer: No Typology Code available for payment source | Attending: Certified Nurse Midwife

## 2020-06-26 ENCOUNTER — Other Ambulatory Visit: Payer: Self-pay | Admitting: Obstetrics and Gynecology

## 2020-06-26 DIAGNOSIS — I8290 Acute embolism and thrombosis of unspecified vein: Secondary | ICD-10-CM

## 2020-06-26 DIAGNOSIS — O1410 Severe pre-eclampsia, unspecified trimester: Secondary | ICD-10-CM

## 2020-06-26 DIAGNOSIS — I10 Essential (primary) hypertension: Secondary | ICD-10-CM

## 2020-06-30 NOTE — Progress Notes (Deleted)
275lb 1

## 2020-07-01 ENCOUNTER — Ambulatory Visit: Payer: No Typology Code available for payment source

## 2020-07-01 ENCOUNTER — Ambulatory Visit: Payer: No Typology Code available for payment source | Attending: Obstetrics and Gynecology

## 2020-07-16 ENCOUNTER — Other Ambulatory Visit: Payer: Self-pay | Admitting: Obstetrics and Gynecology

## 2020-07-16 DIAGNOSIS — O10919 Unspecified pre-existing hypertension complicating pregnancy, unspecified trimester: Secondary | ICD-10-CM

## 2020-07-16 DIAGNOSIS — Z3A12 12 weeks gestation of pregnancy: Secondary | ICD-10-CM

## 2020-07-23 ENCOUNTER — Ambulatory Visit: Payer: Medicaid Other | Attending: Obstetrics and Gynecology

## 2020-07-23 ENCOUNTER — Encounter: Payer: Self-pay | Admitting: *Deleted

## 2020-07-23 ENCOUNTER — Other Ambulatory Visit: Payer: Self-pay | Admitting: Obstetrics and Gynecology

## 2020-07-23 ENCOUNTER — Ambulatory Visit (HOSPITAL_BASED_OUTPATIENT_CLINIC_OR_DEPARTMENT_OTHER): Payer: Medicaid Other | Admitting: Obstetrics

## 2020-07-23 ENCOUNTER — Other Ambulatory Visit: Payer: Self-pay | Admitting: *Deleted

## 2020-07-23 ENCOUNTER — Other Ambulatory Visit: Payer: Self-pay

## 2020-07-23 ENCOUNTER — Ambulatory Visit: Payer: Medicaid Other | Admitting: *Deleted

## 2020-07-23 VITALS — BP 149/84 | HR 100

## 2020-07-23 DIAGNOSIS — O10012 Pre-existing essential hypertension complicating pregnancy, second trimester: Secondary | ICD-10-CM

## 2020-07-23 DIAGNOSIS — O10919 Unspecified pre-existing hypertension complicating pregnancy, unspecified trimester: Secondary | ICD-10-CM

## 2020-07-23 DIAGNOSIS — O09522 Supervision of elderly multigravida, second trimester: Secondary | ICD-10-CM

## 2020-07-23 DIAGNOSIS — O10912 Unspecified pre-existing hypertension complicating pregnancy, second trimester: Secondary | ICD-10-CM | POA: Insufficient documentation

## 2020-07-23 DIAGNOSIS — O99212 Obesity complicating pregnancy, second trimester: Secondary | ICD-10-CM

## 2020-07-23 DIAGNOSIS — Z363 Encounter for antenatal screening for malformations: Secondary | ICD-10-CM

## 2020-07-23 DIAGNOSIS — Z3A16 16 weeks gestation of pregnancy: Secondary | ICD-10-CM | POA: Diagnosis present

## 2020-07-23 DIAGNOSIS — Z86718 Personal history of other venous thrombosis and embolism: Secondary | ICD-10-CM | POA: Diagnosis present

## 2020-07-23 DIAGNOSIS — O09892 Supervision of other high risk pregnancies, second trimester: Secondary | ICD-10-CM | POA: Diagnosis not present

## 2020-07-23 DIAGNOSIS — Z3A12 12 weeks gestation of pregnancy: Secondary | ICD-10-CM

## 2020-07-23 DIAGNOSIS — O09292 Supervision of pregnancy with other poor reproductive or obstetric history, second trimester: Secondary | ICD-10-CM

## 2020-07-23 DIAGNOSIS — E669 Obesity, unspecified: Secondary | ICD-10-CM

## 2020-07-23 DIAGNOSIS — O34219 Maternal care for unspecified type scar from previous cesarean delivery: Secondary | ICD-10-CM

## 2020-07-23 DIAGNOSIS — O322XX0 Maternal care for transverse and oblique lie, not applicable or unspecified: Secondary | ICD-10-CM

## 2020-07-23 NOTE — Progress Notes (Signed)
MFM Consult Note  Candice Holder was seen in consultation due to advanced maternal age, chronic hypertension currently treated with labetalol 100 mg twice a day, and history of a prior DVT during her pregnancy in 2005.    The patient reports that she was being followed at [facility] in [place] in 2005 when at approximately 7 to 8 months of her pregnancy, she felt some tingling in her legs and also noted leg swelling.  She was diagnosed with a DVT in pregnancy and was treated with Lovenox for approximately 6 months.  The DVT occurred spontaneously and was not the result of any trauma or surgery.  She also reports a significant family history of thromboembolic events in her maternal grandmother and maternal aunt.  It is uncertain if she has ever had a work-up for a thrombophilia.  The records from this pregnancy was not available for review today.  The patient reports that she was diagnosed with chronic hypertension in 2014.  She had been treated with hydrochlorothiazide in the past.  She reports that she had an echocardiogram performed in [place] in the past that showed a possible enlarged left ventricle which may be related to her chronic hypertension.  She reports that she is completing a 24-hour urine collection today and will have baseline [place] labs drawn later today.  The patient's current pregnancy has also been complicated by maternal obesity.  Her weight today was 273.3 pounds.  Her BMI is 48.1.  She reports that she had a cell free DNA test drawn earlier in her current pregnancy which indicated a low risk for trisomy 11, 66, and 13.  A female fetus is predicted.  The fetal biometry measurements obtained today indicate an [facility] of January 04, 2021, making her 16 weeks and 3 days pregnant today.  The views of the fetal anatomy were limited today due to her early gestational age and body habitus.  During our consultation today the following were discussed:  Prior DVT in  pregnancy  The patient was advised that as her prior DVT occurred spontaneously in pregnancy and due to her family history of thromboembolic events, she may be at increased risk for developing another DVT in her current pregnancy.   Therefore, I would recommend that she receive prophylactic Lovenox for the duration of her current pregnancy and for 6 weeks postpartum.  Due to her weight of 273.3 pounds today, I would recommend that she receive Lovenox 100 mg once a day for prophylaxis.  As the patient reports that she will most likely be delivered via a repeat cesarean delivery at around 39 weeks, she was advised to continue the prophylactic Lovenox up until the day prior to her scheduled C-section.  She should hold the Lovenox on the day of her scheduled C-section.  This way she will have been off of Lovenox for close to 24 hours and will therefore be eligible to receive regional anesthesia and will not be at increased risk for bleeding.  She should be resumed on prophylactic Lovenox (100 mg daily) 12 hours after her C-section.  The prophylactic Lovenox should be continued for 6 weeks postpartum.  A work-up for thrombophilia or antiphospholipid antibodies is not necessary during her pregnancy as it would not change the recommendations for Lovenox prophylaxis. She may consider being worked up for a blood clotting disorder after she gives birth.  As she will be treated with prophylactic Lovenox, monitoring of anti-factor Xa levels will not be necessary during her pregnancy.  Chronic hypertension in pregnancy  The patient's blood pressures in our office today were 138/94 and 149/84. She is currently treated with labetalol 100 mg twice a day.   The implications and management of chronic hypertension in pregnancy was discussed. The patient was advised that should her blood pressures continue to be elevated, the dosage of labetalol may need to be increased.  The increased risk of superimposed  preeclampsia, an indicated preterm delivery, and possible fetal growth restriction due to chronic hypertension in pregnancy was discussed. The patient was advised that we will continue to follow her closely throughout her pregnancy. We will continue to follow her with monthly growth scans. Weekly fetal testing should be started at around 32 weeks.   To decrease her risk of superimposed preeclampsia, she should start taking a daily baby aspirin (81 mg daily) for preeclampsia prophylaxis as soon as possible.   As the patient reports that an enlarged left ventricle was noted on her prior echocardiogram, she should probably have another echocardiogram performed sometime in her current pregnancy to ensure that no gross heart abnormalities are present.  Advanced maternal age in pregnancy  The patient was reassured that as she had a cell free DNA test indicating a low risk for Down syndrome, her overall risk should be low.  She understands that definitive diagnosis of Down syndrome can only be made via an amniocentesis.   A detailed fetal anatomy scan was scheduled for her in our office for her at around 19 weeks.  Obesity in pregnancy  The patient's weight today was 273.3 pounds.  Her BMI is 48.1.  Due to maternal obesity, the increased risk of gestational diabetes and other pregnancy complications was discussed.  She should receive an early screen for gestational diabetes.  She will already be starting weekly fetal testing due to chronic hypertension at around 32 weeks.  At the end of the consultation the patient stated that all of her questions had been answered to her complete satisfaction.    Thank you for referring this patient for a Maternal-Fetal Medicine consultation.    Total time spent in consultation: 45 minutes  Recommendations:  Start Lovenox 100 mg daily for DVT prophylaxis  Continue prophylactic Lovenox for the duration of her pregnancy and for 6 weeks postpartum  Continue  labetalol for treatment of chronic hypertension  Start daily baby aspirin for preeclampsia prophylaxis  Detailed fetal anatomy scan at 19 weeks.  Growth ultrasounds every 4 to 5 weeks.  Weekly fetal testing to be started at 32 weeks  A maternal echocardiogram should be obtained soon to evaluate her heart

## 2020-08-21 ENCOUNTER — Ambulatory Visit: Payer: Medicaid Other | Attending: Obstetrics

## 2020-08-21 ENCOUNTER — Ambulatory Visit: Payer: Medicaid Other | Admitting: *Deleted

## 2020-08-21 ENCOUNTER — Encounter: Payer: Self-pay | Admitting: *Deleted

## 2020-08-21 ENCOUNTER — Other Ambulatory Visit: Payer: Self-pay

## 2020-08-21 VITALS — BP 132/69 | HR 83

## 2020-08-21 DIAGNOSIS — Z3A20 20 weeks gestation of pregnancy: Secondary | ICD-10-CM

## 2020-08-21 DIAGNOSIS — O10919 Unspecified pre-existing hypertension complicating pregnancy, unspecified trimester: Secondary | ICD-10-CM | POA: Diagnosis present

## 2020-08-21 DIAGNOSIS — O09292 Supervision of pregnancy with other poor reproductive or obstetric history, second trimester: Secondary | ICD-10-CM

## 2020-08-21 DIAGNOSIS — O10912 Unspecified pre-existing hypertension complicating pregnancy, second trimester: Secondary | ICD-10-CM | POA: Insufficient documentation

## 2020-08-21 DIAGNOSIS — E669 Obesity, unspecified: Secondary | ICD-10-CM

## 2020-08-21 DIAGNOSIS — O99212 Obesity complicating pregnancy, second trimester: Secondary | ICD-10-CM | POA: Diagnosis not present

## 2020-08-21 DIAGNOSIS — O2692 Pregnancy related conditions, unspecified, second trimester: Secondary | ICD-10-CM

## 2020-08-21 DIAGNOSIS — O34219 Maternal care for unspecified type scar from previous cesarean delivery: Secondary | ICD-10-CM

## 2020-08-21 DIAGNOSIS — O10012 Pre-existing essential hypertension complicating pregnancy, second trimester: Secondary | ICD-10-CM

## 2020-08-21 DIAGNOSIS — Z363 Encounter for antenatal screening for malformations: Secondary | ICD-10-CM | POA: Diagnosis not present

## 2020-08-21 DIAGNOSIS — O09522 Supervision of elderly multigravida, second trimester: Secondary | ICD-10-CM

## 2020-08-24 ENCOUNTER — Other Ambulatory Visit: Payer: Self-pay

## 2020-08-24 ENCOUNTER — Ambulatory Visit (INDEPENDENT_AMBULATORY_CARE_PROVIDER_SITE_OTHER): Payer: Medicaid Other

## 2020-08-24 ENCOUNTER — Ambulatory Visit (INDEPENDENT_AMBULATORY_CARE_PROVIDER_SITE_OTHER): Payer: Medicaid Other | Admitting: Cardiology

## 2020-08-24 ENCOUNTER — Encounter: Payer: Self-pay | Admitting: *Deleted

## 2020-08-24 ENCOUNTER — Encounter: Payer: Self-pay | Admitting: Cardiology

## 2020-08-24 ENCOUNTER — Other Ambulatory Visit: Payer: Self-pay | Admitting: *Deleted

## 2020-08-24 VITALS — BP 130/80 | HR 90 | Ht 63.0 in | Wt 280.8 lb

## 2020-08-24 DIAGNOSIS — R079 Chest pain, unspecified: Secondary | ICD-10-CM | POA: Diagnosis not present

## 2020-08-24 DIAGNOSIS — I10 Essential (primary) hypertension: Secondary | ICD-10-CM | POA: Diagnosis not present

## 2020-08-24 DIAGNOSIS — O10912 Unspecified pre-existing hypertension complicating pregnancy, second trimester: Secondary | ICD-10-CM

## 2020-08-24 DIAGNOSIS — R002 Palpitations: Secondary | ICD-10-CM | POA: Diagnosis not present

## 2020-08-24 DIAGNOSIS — I517 Cardiomegaly: Secondary | ICD-10-CM | POA: Diagnosis not present

## 2020-08-24 NOTE — Patient Instructions (Signed)
Medication Instructions:  Your physician recommends that you continue on your current medications as directed. Please refer to the Current Medication list given to you today.  Testing/Procedures: Your physician has requested that you have an echocardiogram. Echocardiography is a painless test that uses sound waves to create images of your heart. It provides your doctor with information about the size and shape of your heart and how well your heart's chambers and valves are working. This procedure takes approximately one hour. There are no restrictions for this procedure.  This will be done at our [address] location:  [address]   ZIO XT- Long Term Monitor Instructions   Your physician has requested you wear your ZIO patch monitor___14___days.   This is a single patch monitor.  [employer] supplies one patch monitor per enrollment.  Additional stickers are not available.   Please do not apply patch if you will be having a Nuclear Stress Test, Echocardiogram, Cardiac CT, MRI, or Chest Xray during the time frame you would be wearing the monitor. The patch cannot be worn during these tests.  You cannot remove and re-apply the ZIO XT patch monitor.   Your ZIO patch monitor will be sent USPS Priority mail from [employer] directly to your home address. The monitor may also be mailed to a PO BOX if home delivery is not available.   It may take 3-5 days to receive your monitor after you have been enrolled.   Once you have received you monitor, please review enclosed instructions.  Your monitor has already been registered assigning a specific monitor serial # to you.   Applying the monitor   Shave hair from upper left chest.   Hold abrader disc by orange tab.  Rub abrader in 40 strokes over left upper chest as indicated in your monitor instructions.   Clean area with 4 enclosed alcohol pads .  Use all pads to assure are is cleaned thoroughly.  Let dry.   Apply patch  as indicated in monitor instructions.  Patch will be place under collarbone on left side of chest with arrow pointing upward.   Rub patch adhesive wings for 2 minutes.Remove white label marked "1".  Remove white label marked "2".  Rub patch adhesive wings for 2 additional minutes.   While looking in a mirror, press and release button in center of patch.  A small green light will flash 3-4 times .  This will be your only indicator the monitor has been turned on.     Do not shower for the first 24 hours.  You may shower after the first 24 hours.   Press button if you feel a symptom. You will hear a small click.  Record Date, Time and Symptom in the Patient Log Book.   When you are ready to remove patch, follow instructions on last 2 pages of Patient Log Book.  Stick patch monitor onto last page of Patient Log Book.   Place Patient Log Book in Blue box.  Use locking tab on box and tape box closed securely.  The Orange and White box has prepaid postage on it.  Please place in mailbox as soon as possible.  Your physician should have your test results approximately 7 days after the monitor has been mailed back to [employer].   Call [employer] Customer Care at [phone] if you have questions regarding your ZIO XT patch monitor.  Call them immediately if you see an orange light blinking on your   monitor.   If your monitor falls off in less than 4 days contact our Monitor department at [phone].  If your monitor becomes loose or falls off after 4 days call [employer] at [phone] for suggestions on securing your monitor.    Follow-Up: At [facility], you and your health needs are our priority.  As part of our continuing mission to provide you with exceptional heart care, we have created designated Provider Care Teams.  These Care Teams include your primary Cardiologist (physician) and Advanced Practice Providers (APPs -  Physician Assistants and Nurse Practitioners) who all work  together to provide you with the care you need, when you need it.  We recommend signing up for the patient portal called "MyChart".  Sign up information is provided on this After Visit Summary.  MyChart is used to connect with patients for Virtual Visits (Telemedicine).  Patients are able to view lab/test results, encounter notes, upcoming appointments, etc.  Non-urgent messages can be sent to your provider as well.   To learn more about what you can do with MyChart, go to [email or web address].    Your next appointment:   3 month(s)  The format for your next appointment:   In Person  Provider:   Christopher Schumann, MD    

## 2020-08-24 NOTE — Progress Notes (Signed)
Cardiology Office Note:    Date:  08/24/2020   ID:  Candice Holder, DOB Jul 04, 1985, MRN [id number]  PCP:  Trey Sailors, PA  Cardiologist:  No primary care provider on file.  Electrophysiologist:  None   Referring MD: Rowland Lathe, MD   Chief Complaint  Patient presents with  . Palpitations    History of Present Illness:    Candice Holder is a 35 y.o. female with a hx of hypertension, DVT during prior pregnancy who is currently [redacted] weeks pregnant and referred by Dr. Brien Mates for evaluation for cardiomegaly.  She was diagnosed with DVT during pregnancy in [place] in 2005.  She reports that she was diagnosed with hypertension in 2014 and has been treated on HCTZ.  She reports that echocardiogram in [place] at that time showed possible LV dilatation.  Given her prior DVT with pregnancy, she has been started on Lovenox per OB.  She reports that she has been having chest pain a few times per week.  During episodes reports that it feels like something is sitting on her chest.  It lasts for hours.  It occurs randomly, happens at rest.  Reports has improved since pregnancy started.  Also reports feeling short of breath with minimal exertion.  This was also present prior to pregnancy.  She is also been having palpitations where she feels like heart is racing.  Occurs about twice per week, will last for 30 to 60 minutes.  Sometimes feels lightheaded during episodes.  She denies any syncope.  Reports feeling short of breath during episodes.  Never smoked.  Mother had CHF, reports that she thinks she died of aneurysm but unsure of details.  Brother had CVA at 77.     Past Medical History:  Diagnosis Date  . Hypertension   . Obesity   . VTE (venous thromboembolism)     Past Surgical History:  Procedure Laterality Date  . CESAREAN SECTION      Current Medications: Current Meds  Medication Sig  . aspirin EC 81 MG tablet Take 81 mg by mouth daily. Swallow whole.  . Enoxaparin Sodium  (LOVENOX [place]) Inject into the skin.  Marland Kitchen labetalol (NORMODYNE) 100 MG tablet Take 100 mg by mouth daily.  . Prenatal Vit-Fe Fumarate-FA (PRENATAL MULTIVITAMIN) TABS tablet Take 1 tablet by mouth daily at 12 noon.     Allergies:   Iodine   Social History   Socioeconomic History  . Marital status: Single    Spouse name: Not on file  . Number of children: Not on file  . Years of education: Not on file  . Highest education level: Not on file  Occupational History  . Not on file  Tobacco Use  . Smoking status: Never Smoker  . Smokeless tobacco: Never Used  Vaping Use  . Vaping Use: Never used  Substance and Sexual Activity  . Alcohol use: Not Currently    Comment: social  . Drug use: No  . Sexual activity: Not on file  Other Topics Concern  . Not on file  Social History Narrative  . Not on file   Social Determinants of Health   Financial Resource Strain: Not on file  Food Insecurity: Not on file  Transportation Needs: Not on file  Physical Activity: Not on file  Stress: Not on file  Social Connections: Not on file     Family History: The patient's family history includes Arthritis in her maternal aunt, maternal grandmother, and mother; COPD in her  maternal aunt and mother; Cancer in her maternal aunt; Diabetes in her maternal aunt, maternal grandmother, and mother; Hypertension in her brother, maternal aunt, mother, and sister.  ROS:   Please see the history of present illness.     All other systems reviewed and are negative.  EKGs/Labs/Other Studies Reviewed:    The following studies were reviewed today:   EKG:  EKG is ordered today.  The ekg ordered today demonstrates normal sinus rhythm, rate 90, no ST abnormalities, nonspecific T wave flattening  Recent Labs: 05/19/2020: Hemoglobin 12.1; Platelets 442  Recent Lipid Panel No results found for: CHOL, TRIG, HDL, CHOLHDL, VLDL, LDLCALC, LDLDIRECT  Physical Exam:    VS:  BP 130/80   Pulse 90   Ht 5\' 3"  (1.6 m)    Wt 280 lb 12.8 oz (127.4 kg)   LMP 03/31/2020   BMI 49.74 kg/m     Wt Readings from Last 3 Encounters:  08/24/20 280 lb 12.8 oz (127.4 kg)  05/19/20 285 lb 6.4 oz (129.5 kg)  11/12/17 260 lb (117.9 kg)     GEN: Well nourished, well developed in no acute distress HEENT: Normal NECK: No JVD; No carotid bruits LYMPHATICS: No lymphadenopathy CARDIAC: RRR, no murmurs, rubs, gallops RESPIRATORY:  Clear to auscultation without rales, wheezing or rhonchi  ABDOMEN: Soft, non-tender, non-distended MUSCULOSKELETAL:  No edema; No deformity  SKIN: Warm and dry NEUROLOGIC:  Alert and oriented x 3 PSYCHIATRIC:  Normal affect   ASSESSMENT:    1. Palpitations   2. Chest pain of uncertain etiology   3. Cardiomegaly   4. Essential hypertension    PLAN:    Palpitations: Description concerning for arrhythmia, will evaluate with Zio patch x2 weeks  Chest pain: Description suggest noncardiac chest pain, as describes nonexertional pain occurring for hours.  No further cardiac work-up recommended at this time.  Cardiomegaly: LV dilatation noted on prior echocardiogram reportedly in [place] in 2014.  Will check echo  DVT: Occurred during prior pregnancy in 2004.  Started on Lovenox for current pregnancy per OB  Hypertension: On labetalol 100 mg twice daily.  Appears controlled  RTC in 3 months   Medication Adjustments/Labs and Tests Ordered: Current medicines are reviewed at length with the patient today.  Concerns regarding medicines are outlined above.  Orders Placed This Encounter  Procedures  . LONG TERM MONITOR (3-14 DAYS)  . EKG 12-Lead  . ECHOCARDIOGRAM COMPLETE   No orders of the defined types were placed in this encounter.   Patient Instructions  Medication Instructions:  Your physician recommends that you continue on your current medications as directed. Please refer to the Current Medication list given to you today.  Testing/Procedures: Your physician has requested  that you have an echocardiogram. Echocardiography is a painless test that uses sound waves to create images of your heart. It provides your doctor with information about the size and shape of your heart and how well your heart's chambers and valves are working. This procedure takes approximately one hour. There are no restrictions for this procedure.  This will be done at our [facility] location:  [place] has requested you wear your ZIO patch monitor 14 days.   This is a single patch monitor.  [employer] supplies one patch monitor per enrollment.  Additional stickers are not available.   Please do not apply patch if you will be having a Nuclear Stress Test,  Echocardiogram, Cardiac CT, MRI, or Chest Xray during the time frame you would be wearing the monitor. The patch cannot be worn during these tests.  You cannot remove and re-apply the ZIO XT patch monitor.   Your ZIO patch monitor will be sent USPS Priority mail from [facility] directly to your home address. The monitor may also be mailed to a PO BOX if home delivery is not available.   It may take 3-5 days to receive your monitor after you have been enrolled.   Once you have received you monitor, please review enclosed instructions.  Your monitor has already been registered assigning a specific monitor serial # to you.   Applying the monitor   Shave hair from upper left chest.   Hold abrader disc by orange tab.  Rub abrader in 40 strokes over left upper chest as indicated in your monitor instructions.   Clean area with 4 enclosed alcohol pads .  Use all pads to assure are is cleaned thoroughly.  Let dry.   Apply patch as indicated in monitor instructions.  Patch will be place under collarbone on left side of chest with arrow pointing upward.   Rub patch adhesive wings for 2 minutes.Remove white label marked "1".  Remove white label marked "2".   Rub patch adhesive wings for 2 additional minutes.   While looking in a mirror, press and release button in center of patch.  A small green light will flash 3-4 times .  This will be your only indicator the monitor has been turned on.     Do not shower for the first 24 hours.  You may shower after the first 24 hours.   Press button if you feel a symptom. You will hear a small click.  Record Date, Time and Symptom in the Patient Log Book.   When you are ready to remove patch, follow instructions on last 2 pages of Patient Log Book.  Stick patch monitor onto last page of Patient Log Book.   Place Patient Log Book in [place] box.  Use locking tab on box and tape box closed securely.  The [employer] has [employer] on it.  Please place in mailbox as soon as possible.  Your physician should have your test results approximately 7 days after the monitor has been mailed back to [facility]   Call [place] at [phone] if you have questions regarding your ZIO XT patch monitor.  Call them immediately if you see an orange light blinking on your monitor.   If your monitor falls off in less than 4 days contact our Monitor department at [phone].  If your monitor becomes loose or falls off after 4 days call [employer] at [phone] for suggestions on securing your monitor.    Follow-Up: At [facility], you and your health needs are our priority.  As part of our continuing mission to provide you with exceptional heart care, we have created designated Provider Care Teams.  These Care Teams include your primary Cardiologist (physician) and Advanced Practice Providers (APPs -  Physician Assistants and Nurse Practitioners) who all work together to provide you with the care you need, when you need it.  We recommend signing up for the patient portal called "MyChart".  Sign up information is provided on this After Visit Summary.  MyChart is used to connect with patients  for Virtual Visits (Telemedicine).  Patients are able to view lab/test results, encounter notes, upcoming appointments, etc.  Non-urgent  messages can be sent to your provider as well.   To learn more about what you can do with MyChart, go to [email or web address].    Your next appointment:   3 month(s)  The format for your next appointment:   In Person  Provider:   Oswaldo Milian, MD       Signed, Donato Heinz, MD  08/24/2020 10:17 AM    [place]

## 2020-08-24 NOTE — Progress Notes (Signed)
Patient ID: Candice Holder, female   DOB: [birth year], 35 y.o.   MRN: [id number] Patient enrolled for [employer] to ship a 14 day ZIO XT long term holter monitor to her home.

## 2020-09-02 DIAGNOSIS — R002 Palpitations: Secondary | ICD-10-CM

## 2020-09-17 ENCOUNTER — Other Ambulatory Visit: Payer: Self-pay

## 2020-09-17 ENCOUNTER — Ambulatory Visit (HOSPITAL_COMMUNITY): Payer: Medicaid Other | Attending: Internal Medicine

## 2020-09-17 DIAGNOSIS — R002 Palpitations: Secondary | ICD-10-CM | POA: Insufficient documentation

## 2020-09-17 LAB — ECHOCARDIOGRAM COMPLETE
Area-P 1/2: 2.83 cm2
S' Lateral: 3 cm

## 2020-09-18 ENCOUNTER — Other Ambulatory Visit: Payer: Self-pay | Admitting: *Deleted

## 2020-09-18 ENCOUNTER — Ambulatory Visit: Payer: Medicaid Other | Admitting: *Deleted

## 2020-09-18 ENCOUNTER — Ambulatory Visit: Payer: Medicaid Other | Attending: Obstetrics and Gynecology

## 2020-09-18 ENCOUNTER — Encounter: Payer: Self-pay | Admitting: *Deleted

## 2020-09-18 VITALS — BP 150/85 | HR 86

## 2020-09-18 DIAGNOSIS — Z363 Encounter for antenatal screening for malformations: Secondary | ICD-10-CM | POA: Diagnosis not present

## 2020-09-18 DIAGNOSIS — O10912 Unspecified pre-existing hypertension complicating pregnancy, second trimester: Secondary | ICD-10-CM

## 2020-09-18 DIAGNOSIS — O10012 Pre-existing essential hypertension complicating pregnancy, second trimester: Secondary | ICD-10-CM | POA: Diagnosis not present

## 2020-09-18 DIAGNOSIS — O99212 Obesity complicating pregnancy, second trimester: Secondary | ICD-10-CM | POA: Diagnosis not present

## 2020-09-18 DIAGNOSIS — Z8759 Personal history of other complications of pregnancy, childbirth and the puerperium: Secondary | ICD-10-CM

## 2020-09-18 DIAGNOSIS — O321XX0 Maternal care for breech presentation, not applicable or unspecified: Secondary | ICD-10-CM

## 2020-09-18 DIAGNOSIS — O2232 Deep phlebothrombosis in pregnancy, second trimester: Secondary | ICD-10-CM

## 2020-09-18 DIAGNOSIS — Z8632 Personal history of gestational diabetes: Secondary | ICD-10-CM

## 2020-09-18 DIAGNOSIS — Z3A24 24 weeks gestation of pregnancy: Secondary | ICD-10-CM

## 2020-09-18 DIAGNOSIS — O09522 Supervision of elderly multigravida, second trimester: Secondary | ICD-10-CM

## 2020-09-18 DIAGNOSIS — I82409 Acute embolism and thrombosis of unspecified deep veins of unspecified lower extremity: Secondary | ICD-10-CM

## 2020-09-18 DIAGNOSIS — O34219 Maternal care for unspecified type scar from previous cesarean delivery: Secondary | ICD-10-CM

## 2020-09-20 ENCOUNTER — Encounter (HOSPITAL_COMMUNITY): Payer: Self-pay | Admitting: Obstetrics and Gynecology

## 2020-09-20 ENCOUNTER — Inpatient Hospital Stay (HOSPITAL_COMMUNITY)
Admission: AD | Admit: 2020-09-20 | Discharge: 2020-09-21 | Disposition: A | Discharge status: Home / Self Care | Payer: Medicaid Other | Attending: Obstetrics and Gynecology | Admitting: Obstetrics and Gynecology

## 2020-09-20 ENCOUNTER — Other Ambulatory Visit: Payer: Self-pay

## 2020-09-20 DIAGNOSIS — Z79899 Other long term (current) drug therapy: Secondary | ICD-10-CM | POA: Diagnosis not present

## 2020-09-20 DIAGNOSIS — Z7982 Long term (current) use of aspirin: Secondary | ICD-10-CM | POA: Diagnosis not present

## 2020-09-20 DIAGNOSIS — Z20822 Contact with and (suspected) exposure to covid-19: Secondary | ICD-10-CM | POA: Insufficient documentation

## 2020-09-20 DIAGNOSIS — O99212 Obesity complicating pregnancy, second trimester: Secondary | ICD-10-CM | POA: Insufficient documentation

## 2020-09-20 DIAGNOSIS — Z28310 Unvaccinated for covid-19: Secondary | ICD-10-CM | POA: Diagnosis not present

## 2020-09-20 DIAGNOSIS — O10912 Unspecified pre-existing hypertension complicating pregnancy, second trimester: Secondary | ICD-10-CM | POA: Diagnosis not present

## 2020-09-20 DIAGNOSIS — O99891 Other specified diseases and conditions complicating pregnancy: Secondary | ICD-10-CM

## 2020-09-20 DIAGNOSIS — R0602 Shortness of breath: Secondary | ICD-10-CM | POA: Diagnosis not present

## 2020-09-20 DIAGNOSIS — Z3A24 24 weeks gestation of pregnancy: Secondary | ICD-10-CM | POA: Diagnosis not present

## 2020-09-20 DIAGNOSIS — R079 Chest pain, unspecified: Secondary | ICD-10-CM | POA: Diagnosis not present

## 2020-09-20 DIAGNOSIS — O26892 Other specified pregnancy related conditions, second trimester: Secondary | ICD-10-CM | POA: Diagnosis not present

## 2020-09-20 LAB — COMPREHENSIVE METABOLIC PANEL
ALT: 17 U/L (ref 0–44)
AST: 16 U/L (ref 15–41)
Albumin: 3 g/dL — ABNORMAL LOW (ref 3.5–5.0)
Alkaline Phosphatase: 58 U/L (ref 38–126)
Anion gap: 6 (ref 5–15)
BUN: <5 mg/dL — ABNORMAL LOW (ref 6–20)
CO2: 23 mmol/L (ref 22–32)
Calcium: 9.3 mg/dL (ref 8.9–10.3)
Chloride: 107 mmol/L (ref 98–111)
Creatinine, Ser: 0.51 mg/dL (ref 0.44–1.00)
GFR, Estimated: >60 mL/min (ref >60)
Glucose, Bld: 87 mg/dL (ref 70–99)
Potassium: 4 mmol/L (ref 3.5–5.1)
Sodium: 136 mmol/L (ref 135–145)
Total Bilirubin: 0.4 mg/dL (ref 0.3–1.2)
Total Protein: 6.2 g/dL — ABNORMAL LOW (ref 6.5–8.1)

## 2020-09-20 LAB — CBC WITH DIFFERENTIAL/PLATELET
Abs Immature Granulocytes: 0.05 10*3/uL (ref 0.00–0.07)
Basophils Absolute: 0 10*3/uL (ref 0.0–0.1)
Basophils Relative: 0 %
Eosinophils Absolute: 0 10*3/uL (ref 0.0–0.5)
Eosinophils Relative: 0 %
HCT: 32 % — ABNORMAL LOW (ref 36.0–46.0)
Hemoglobin: 10.8 g/dL — ABNORMAL LOW (ref 12.0–15.0)
Immature Granulocytes: 1 %
Lymphocytes Relative: 20 %
Lymphs Abs: 2 10*3/uL (ref 0.7–4.0)
MCH: 30.3 pg (ref 26.0–34.0)
MCHC: 33.8 g/dL (ref 30.0–36.0)
MCV: 89.6 fL (ref 80.0–100.0)
Monocytes Absolute: 0.6 10*3/uL (ref 0.1–1.0)
Monocytes Relative: 6 %
Neutro Abs: 7.1 10*3/uL (ref 1.7–7.7)
Neutrophils Relative %: 73 %
Platelets: 384 10*3/uL (ref 150–400)
RBC: 3.57 MIL/uL — ABNORMAL LOW (ref 3.87–5.11)
RDW: 14.5 % (ref 11.5–15.5)
WBC: 9.8 10*3/uL (ref 4.0–10.5)
nRBC: 0 % (ref 0.0–0.2)

## 2020-09-20 MED ORDER — LORATADINE 10 MG PO TABS
10.0000 mg | ORAL_TABLET | Freq: Every day | ORAL | Status: DC
  Filled 2020-09-20: qty 1

## 2020-09-20 MED ORDER — ACETAMINOPHEN 500 MG PO TABS
1000.0000 mg | ORAL_TABLET | Freq: Once | ORAL | Status: AC
  Administered 2020-09-20: 1000 mg via ORAL
  Filled 2020-09-20: qty 2

## 2020-09-20 MED ORDER — LIDOCAINE VISCOUS HCL 2 % MT SOLN
15.0000 mL | Freq: Once | OROMUCOSAL | Status: AC
  Administered 2020-09-21: 15 mL via ORAL
  Filled 2020-09-20: qty 15

## 2020-09-20 MED ORDER — LORATADINE 10 MG PO TABS: 10.0000 mg | ORAL_TABLET | Freq: Every day | ORAL | Status: DC

## 2020-09-20 MED ORDER — ALUM & MAG HYDROXIDE-SIMETH 200-200-20 MG/5ML PO SUSP
30.0000 mL | Freq: Once | ORAL | Status: AC
  Administered 2020-09-21: 30 mL via ORAL
  Filled 2020-09-20: qty 30

## 2020-09-20 NOTE — MAU Note (Signed)
Pt represents to MAU with c/o chest pain, SOB, and abdominal pain.  Pt reports that it started this am.  No vaginal bleeding.   +FM

## 2020-09-20 NOTE — MAU Provider Note (Signed)
History     CSN: [id number]  Arrival date and time: 09/20/20 2127   Event Date/Time   First Provider Initiated Contact with Patient 09/20/20 2241      Chief Complaint  Patient presents with  . Abdominal Pain  . Shortness of Breath  . Chest Pain   Candice Holder is a 35 y.o. [id number] at [redacted]w[redacted]d who receives care at [facility].  She presents today for Abdominal Pain, Shortness of Breath, and Chest Pain.  She reports her symptoms started earlier today and describes the chest pain as "something is on my chest...like pressure and sharp shooting pain b/c it is slow moving."  She states the pain is intermittent in nature, but the pressure is constant.  She rates the pain a 7-8/10.  She reports she has tried laying down without relief and it is exaggerated with "moving around."  Patient states she is also having abdominal pain in the middle of her stomach that she describes as "shooting pain."  She states this started about 3 hours and is intermittent in nature.  She states it has no aggravating or relieving factors.  She rates the pain a 8/10 when it occurs.  She endorses h/o CHTN and is on labetalol, but does not know the dosage.  She reports she takes it once daily and did complete her dosage. She endorses fetal movement and denies vaginal bleeding, discharge, or leaking. She denies contractions. Patient states she has not been vaccinated for covid and denies known exposure.  Patient does not work outside the [employer] and endorses mask usage in public.    OB History    Gravida  4   Para  1   Term  1   Preterm      AB  2   Living  1     SAB  1   IAB  1   Ectopic      Multiple      Live Births              Past Medical History:  Diagnosis Date  . Hypertension   . Obesity   . VTE (venous thromboembolism)     Past Surgical History:  Procedure Laterality Date  . CESAREAN SECTION      Family History  Problem Relation Age of Onset  . Arthritis Mother   . COPD Mother   .  Diabetes Mother   . Hypertension Mother   . Hypertension Sister   . Hypertension Brother   . Arthritis Maternal Aunt   . Cancer Maternal Aunt   . COPD Maternal Aunt   . Diabetes Maternal Aunt   . Hypertension Maternal Aunt   . Arthritis Maternal Grandmother   . Diabetes Maternal Grandmother     Social History   Tobacco Use  . Smoking status: Never Smoker  . Smokeless tobacco: Never Used  Vaping Use  . Vaping Use: Never used  Substance Use Topics  . Alcohol use: Not Currently    Comment: social  . Drug use: No    Allergies:  Allergies  Allergen Reactions  . Shellfish Allergy Hives and Swelling  . Iodine Hives    Medications Prior to Admission  Medication Sig Dispense Refill Last Dose  . aspirin EC 81 MG tablet Take 81 mg by mouth daily. Swallow whole.   09/20/2020 at Unknown time  . Enoxaparin Sodium (LOVENOX [place]) Inject into the skin.   09/20/2020 at 0930  . famotidine (PEPCID) 20 MG tablet Take 20 mg  by mouth at bedtime.   09/20/2020 at Unknown time  . labetalol (NORMODYNE) 100 MG tablet Take 100 mg by mouth daily.   09/20/2020 at Unknown time  . Prenatal Vit-Fe Fumarate-FA (PRENATAL MULTIVITAMIN) TABS tablet Take 1 tablet by mouth daily at 12 noon.   09/20/2020 at Unknown time    Review of Systems  Constitutional: Negative for chills and fever.  HENT: Positive for congestion, sinus pressure, sneezing and sore throat. Negative for sinus pain.        Reports seasonal allergies, but not taking anything.   Eyes: Negative for visual disturbance.  Respiratory: Positive for shortness of breath. Negative for cough and chest tightness.   Cardiovascular: Positive for chest pain.  Gastrointestinal: Positive for abdominal pain and nausea. Negative for vomiting.  Genitourinary: Negative for difficulty urinating, dysuria, vaginal bleeding and vaginal discharge.  Neurological: Positive for headaches. Negative for dizziness and light-headedness.   Physical Exam   Blood pressure  119/83, pulse (!) 102, temperature 98 F (36.7 C), temperature source Oral, resp. rate 18, height 5' 3.5" (1.613 m), weight 125.8 kg, last menstrual period 03/31/2020, SpO2 95 %.  Physical Exam Constitutional:      General: She is not in acute distress.    Appearance: She is well-developed. She is obese. She is not toxic-appearing.  HENT:     Head: Normocephalic and atraumatic.  Eyes:     Conjunctiva/sclera: Conjunctivae normal.  Cardiovascular:     Rate and Rhythm: Normal rate and regular rhythm.     Heart sounds: Normal heart sounds.  Pulmonary:     Effort: Pulmonary effort is normal. No respiratory distress.     Breath sounds: Examination of the right-lower field reveals decreased breath sounds. Decreased breath sounds present. No wheezing, rhonchi or rales.  Abdominal:     General: Bowel sounds are normal.     Tenderness: There is no abdominal tenderness.     Comments: Gravid  Musculoskeletal:        General: Normal range of motion.     Cervical back: Normal range of motion.  Skin:    General: Skin is warm and dry.  Neurological:     Mental Status: She is alert and oriented to person, place, and time.  Psychiatric:        Mood and Affect: Mood normal.        Behavior: Behavior normal.     Fetal Assessment 145 bpm, Mod Var, -Decels, +Accels Toco: No Ctx graphed  MAU Course   Results for orders placed or performed during the hospital encounter of 09/20/20 (from the past 24 hour(s))  CBC with Differential/Platelet     Status: Abnormal   Collection Time: 09/20/20 11:11 PM  Result Value Ref Range   WBC 9.8 4.0 - 10.5 K/uL   RBC 3.57 (L) 3.87 - 5.11 MIL/uL   Hemoglobin 10.8 (L) 12.0 - 15.0 g/dL   HCT 32.0 (L) 36.0 - 46.0 %   MCV 89.6 80.0 - 100.0 fL   MCH 30.3 26.0 - 34.0 pg   MCHC 33.8 30.0 - 36.0 g/dL   RDW 14.5 11.5 - 15.5 %   Platelets 384 150 - 400 K/uL   nRBC 0.0 0.0 - 0.2 %   Neutrophils Relative % 73 %   Neutro Abs 7.1 1.7 - 7.7 K/uL   Lymphocytes  Relative 20 %   Lymphs Abs 2.0 0.7 - 4.0 K/uL   Monocytes Relative 6 %   Monocytes Absolute 0.6 0.1 - 1.0 K/uL  Eosinophils Relative 0 %   Eosinophils Absolute 0.0 0.0 - 0.5 K/uL   Basophils Relative 0 %   Basophils Absolute 0.0 0.0 - 0.1 K/uL   Immature Granulocytes 1 %   Abs Immature Granulocytes 0.05 0.00 - 0.07 K/uL  Comprehensive metabolic panel     Status: Abnormal   Collection Time: 09/20/20 11:11 PM  Result Value Ref Range   Sodium 136 135 - 145 mmol/L   Potassium 4.0 3.5 - 5.1 mmol/L   Chloride 107 98 - 111 mmol/L   CO2 23 22 - 32 mmol/L   Glucose, Bld 87 70 - 99 mg/dL   BUN <5 (L) 6 - 20 mg/dL   Creatinine, Ser 0.51 0.44 - 1.00 mg/dL   Calcium 9.3 8.9 - 10.3 mg/dL   Total Protein 6.2 (L) 6.5 - 8.1 g/dL   Albumin 3.0 (L) 3.5 - 5.0 g/dL   AST 16 15 - 41 U/L   ALT 17 0 - 44 U/L   Alkaline Phosphatase 58 38 - 126 U/L   Total Bilirubin 0.4 0.3 - 1.2 mg/dL   GFR, Estimated >60 >60 mL/min   Anion gap 6 5 - 15   DG Chest 1 View  Result Date: 09/21/2020 CLINICAL DATA:  Shortness of breath and chest pain EXAM: CHEST  1 VIEW COMPARISON:  05/13/2018 FINDINGS: Low lung volumes. No focal opacity or pleural effusion. Stable cardiomediastinal silhouette. No pneumothorax IMPRESSION: No active disease. Electronically Signed   By: Donavan Foil M.D.   On: 09/21/2020 00:32    MDM PE Labs: CBC/D, CMP EFM EKG Chest XRay Antacid Analgesic H1 Antagonist  Assessment and Plan  35 year old G4P1021  SIUP at 24.5 weeks Cat I FT Chest Pain SOB  -Labs ordered -Exam performed and findings discussed. -Okay to discontinue fetal monitoring-Reassuring. -Discussed usage of allergy medication.  Will give claritin now. -Will perform Covid test. -Will perform EKG. -Informed that further testing may be necessary depending on response to interventions.  -Patient agreeable and without questions. -Will await results.  Maryann Conners MSN, CNM 09/20/2020, 10:41 PM   Reassessment (11:22  PM) -EKG returns and reviewed by A. Sylvester Harder, MD. Reports normal. -Will await labs.   Reassessment (11:42 PM) -Labs return without significant findings. -Patient requests medication for heartburn. -Patient endorses that pain is unchanged from when she was seen by cardiology on March 14th.  -Will perform chest x ray to r/o pneumonia as decreased BS noted at right base. -Patient agreeable. -Will await results  Reassessment (12:36 AM) -Patient reports improvement in pain with GI Cocktail. -Patient also reports chest congestion has improved.  -Chest Xray normal. -Informed that since pain is no different from when previously evaluated, we would do no further testing. -Discussed compliance with medication and follow up as scheduled.  -Patient agreeable and questions if symptoms could be related to pregnancy. -Informed that pregnancy could not be ruled out, but provider could not say that symptoms are definitively pregnancy related. However, pregnancy is a known contributor. -Informed that Covid pending. -Encouraged to call or return to MAU if symptoms worsen or with the onset of new symptoms. -Discharged to home in stable condition.  Maryann Conners MSN, CNM Advanced Practice Provider, [facility]

## 2020-09-21 ENCOUNTER — Inpatient Hospital Stay (HOSPITAL_COMMUNITY): Payer: Medicaid Other

## 2020-09-21 DIAGNOSIS — R0602 Shortness of breath: Secondary | ICD-10-CM

## 2020-09-21 DIAGNOSIS — Z3A24 24 weeks gestation of pregnancy: Secondary | ICD-10-CM

## 2020-09-21 DIAGNOSIS — R079 Chest pain, unspecified: Secondary | ICD-10-CM

## 2020-09-21 DIAGNOSIS — O99891 Other specified diseases and conditions complicating pregnancy: Secondary | ICD-10-CM

## 2020-09-21 LAB — RESP PANEL BY RT-PCR (FLU A&B, COVID) ARPGX2
Influenza A by PCR: NEGATIVE
Influenza B by PCR: NEGATIVE
SARS Coronavirus 2 by RT PCR: NEGATIVE

## 2020-09-21 NOTE — Discharge Instructions (Signed)
Shortness of Breath, Adult Shortness of breath means you have trouble breathing. Shortness of breath could be a sign of a medical problem. Follow these instructions at home:  Watch for any changes in your symptoms.  Do not use any products that contain nicotine or tobacco, such as cigarettes, e-cigarettes, and chewing tobacco.  Do not smoke. Smoking can cause shortness of breath. If you need help to quit smoking, ask your doctor.  Avoid things that can make it harder to breathe, such as: ? Mold. ? Dust. ? Air pollution. ? Chemical smells. ? Things that can cause allergy symptoms (allergens), if you have allergies.  Keep your living space clean. Use products that help remove mold and dust.  Rest as needed. Slowly return to your normal activities.  Take over-the-counter and prescription medicines only as told by your doctor. This includes oxygen therapy and inhaled medicines.  Keep all follow-up visits as told by your doctor. This is important.   Contact a doctor if:  Your condition does not get better as soon as expected.  You have a hard time doing your normal activities, even after you rest.  You have new symptoms. Get help right away if:  Your shortness of breath gets worse.  You have trouble breathing when you are resting.  You feel light-headed or you pass out (faint).  You have a cough that is not helped by medicines.  You cough up blood.  You have pain with breathing.  You have pain in your chest, arms, shoulders, or belly (abdomen).  You have a fever.  You cannot walk up stairs.  You cannot exercise the way you normally do. These symptoms may represent a serious problem that is an emergency. Do not wait to see if the symptoms will go away. Get medical help right away. Call your local emergency services (911 in the [place] Do not drive yourself to the hospital. Summary  Shortness of breath is when you have trouble breathing enough air. It can be a sign of a  medical problem.  Avoid things that make it hard for you to breathe, such as smoking, pollution, mold, and dust.  Watch for any changes in your symptoms. Contact your doctor if you do not get better or you get worse. This information is not intended to replace advice given to you by your health care provider. Make sure you discuss any questions you have with your health care provider. Document Revised: 10/30/2017 Document Reviewed: 10/30/2017 Elsevier Patient Education  2021 Elsevier Inc.  

## 2020-10-13 ENCOUNTER — Other Ambulatory Visit: Payer: Self-pay

## 2020-10-13 ENCOUNTER — Inpatient Hospital Stay (HOSPITAL_COMMUNITY)
Admission: AD | Admit: 2020-10-13 | Discharge: 2020-10-13 | Disposition: A | Discharge status: Home / Self Care | Payer: Medicaid Other | Attending: Obstetrics & Gynecology | Admitting: Obstetrics & Gynecology

## 2020-10-13 ENCOUNTER — Other Ambulatory Visit (HOSPITAL_COMMUNITY): Payer: Self-pay | Admitting: Obstetrics & Gynecology

## 2020-10-13 DIAGNOSIS — R200 Anesthesia of skin: Secondary | ICD-10-CM

## 2020-10-13 DIAGNOSIS — Z86718 Personal history of other venous thrombosis and embolism: Secondary | ICD-10-CM | POA: Diagnosis not present

## 2020-10-13 DIAGNOSIS — Z3A00 Weeks of gestation of pregnancy not specified: Secondary | ICD-10-CM | POA: Diagnosis not present

## 2020-10-13 DIAGNOSIS — O09529 Supervision of elderly multigravida, unspecified trimester: Secondary | ICD-10-CM | POA: Diagnosis not present

## 2020-10-13 DIAGNOSIS — O26899 Other specified pregnancy related conditions, unspecified trimester: Secondary | ICD-10-CM | POA: Diagnosis not present

## 2020-10-13 DIAGNOSIS — R202 Paresthesia of skin: Secondary | ICD-10-CM | POA: Insufficient documentation

## 2020-10-13 NOTE — MAU Provider Note (Signed)
Event Date/Time   First Provider Initiated Contact with Patient 10/13/20 2014      S Ms. Candice Holder is a 35 y.o. [phone] patient who presents to MAU today with complaint of right leg numbness for 3 days.  Feels tingly at times  Has a history of DVT.  Is already on Lovenox Was supposed to come this am but pt stated she waited "to see if it would go away" and was "scared of the test". No pain..   O BP 136/88 (BP Location: Right Arm)   Pulse (!) 102   Temp 98.6 F (37 C) (Oral)   Resp 17   Ht 5' 3.5" (1.613 m)   Wt 127 kg   LMP 03/31/2020   SpO2 98%   BMI 48.82 kg/m  Physical Exam Constitutional:      General: She is not in acute distress.    Appearance: Normal appearance. She is not ill-appearing.  HENT:     Head: Normocephalic.  Cardiovascular:     Rate and Rhythm: Normal rate.  Pulmonary:     Effort: Pulmonary effort is normal.  Musculoskeletal:        General: Swelling (bilateral Tr-1+ edema) present. No deformity.  Skin:    General: Skin is warm and dry.  Neurological:     General: No focal deficit present.     Mental Status: She is alert.  Psychiatric:        Mood and Affect: Mood normal.     A Medical screening exam complete @DX @  P Discharge from MAU in stable condition Message sent to Dr Shellia Cleverly to arrange Doppler study for AM Pt denies chest pain or dyspnea Warning signs for worsening condition that would warrant emergency follow-up discussed Patient may return to MAU as needed   Seabron Spates, CNM 10/13/2020 8:14 PM

## 2020-10-13 NOTE — MAU Note (Signed)
Pt reports numbness and tingling in her lower right leg x 3 days. Denies any other symptoms. Reports good fetal movement.

## 2020-10-13 NOTE — Discharge Instructions (Signed)
Vascular Ultrasound A vascular ultrasound is a painless test that is done to see if you have blood flow problems or clots in your blood vessels. It uses harmless sound waves to take pictures of the arteries and veins in your body. The pictures are taken by passing a device (transducer) over certain areas of your body. Tell a health care provider about:  Any allergies you have.  All medicines you are taking, including vitamins, herbs, eye drops, creams, and over-the-counter medicines.  Any blood disorders you have.  Any surgeries you have had.  Any medical conditions you have.  Whether you are pregnant or may be pregnant. What are the risks? Generally, this is a safe procedure. There are no known risks or complications that arise from having an ultrasound. What happens before the procedure?  If the ultrasound scan involves your upper abdomen, you may be told not to eat or chew gum the morning of your exam. Follow your health care provider's instructions.  Do not smoke or use nicotine products at least 30 minutes before the exam.  During the test, a gel will be applied to your skin. What happens during the procedure?  A gel will be applied to your skin. It may feel cool.  The transducer will be placed on the area to be examined.  Pictures will be taken. They will be displayed on one or more monitors that look like small television screens.   What happens after the procedure?  You can safely drive home immediately after your exam.  You may resume your normal diet and activities.  Keep all follow-up visits as told by your health care provider. This is important.  It is up to you to get your test results. Ask your health care provider, or the department that is doing the test: ? When will my results be ready? ? How will I get my results? ? What are my treatment options? ? What other tests do I need? ? What are my next steps? Summary  A vascular ultrasound is a painless test  that is done to see if you have blood flow problems or clots in your blood vessels. It uses harmless sound waves to take pictures of the arteries and veins in your body.  Generally, this is a safe procedure. There are no known risks or complications that arise from having an ultrasound.  A gel will be applied to your skin. It may feel cool. The device that takes the pictures (transducer) will then be placed on the area to be examined.  It is up to you to get your test results. Ask your health care provider or the department that is doing the test when your results will be ready and how you will get your results. This information is not intended to replace advice given to you by your health care provider. Make sure you discuss any questions you have with your health care provider. Document Revised: 09/18/2018 Document Reviewed: 07/06/2017 Elsevier Patient Education  2021 [employer].

## 2020-10-14 ENCOUNTER — Encounter (HOSPITAL_COMMUNITY): Payer: Self-pay

## 2020-10-14 ENCOUNTER — Ambulatory Visit (HOSPITAL_COMMUNITY)
Admission: RE | Admit: 2020-10-14 | Discharge: 2020-10-14 | Disposition: A | Discharge status: Home / Self Care | Payer: Medicaid Other | Source: Ambulatory Visit | Attending: Obstetrics & Gynecology | Admitting: Obstetrics & Gynecology

## 2020-10-14 DIAGNOSIS — R200 Anesthesia of skin: Secondary | ICD-10-CM | POA: Insufficient documentation

## 2020-10-14 NOTE — Progress Notes (Signed)
RLE venous duplex has been completed.  Results can be found under chart review under CV PROC. 10/14/2020 3:59 PM Zarriah Starkel RVT, RDMS

## 2020-10-16 ENCOUNTER — Encounter: Payer: Self-pay | Admitting: *Deleted

## 2020-10-16 ENCOUNTER — Ambulatory Visit: Payer: Medicaid Other | Attending: Obstetrics

## 2020-10-16 ENCOUNTER — Other Ambulatory Visit: Payer: Self-pay

## 2020-10-16 ENCOUNTER — Ambulatory Visit: Payer: Medicaid Other | Admitting: *Deleted

## 2020-10-16 VITALS — BP 137/88 | HR 101

## 2020-10-16 DIAGNOSIS — Z363 Encounter for antenatal screening for malformations: Secondary | ICD-10-CM

## 2020-10-16 DIAGNOSIS — O10013 Pre-existing essential hypertension complicating pregnancy, third trimester: Secondary | ICD-10-CM | POA: Diagnosis not present

## 2020-10-16 DIAGNOSIS — O99213 Obesity complicating pregnancy, third trimester: Secondary | ICD-10-CM | POA: Diagnosis not present

## 2020-10-16 DIAGNOSIS — O10912 Unspecified pre-existing hypertension complicating pregnancy, second trimester: Secondary | ICD-10-CM | POA: Insufficient documentation

## 2020-10-16 DIAGNOSIS — O34219 Maternal care for unspecified type scar from previous cesarean delivery: Secondary | ICD-10-CM

## 2020-10-16 DIAGNOSIS — O10913 Unspecified pre-existing hypertension complicating pregnancy, third trimester: Secondary | ICD-10-CM | POA: Insufficient documentation

## 2020-10-16 DIAGNOSIS — O99413 Diseases of the circulatory system complicating pregnancy, third trimester: Secondary | ICD-10-CM

## 2020-10-16 DIAGNOSIS — O09523 Supervision of elderly multigravida, third trimester: Secondary | ICD-10-CM

## 2020-10-16 DIAGNOSIS — Z3A28 28 weeks gestation of pregnancy: Secondary | ICD-10-CM

## 2020-10-16 DIAGNOSIS — I82409 Acute embolism and thrombosis of unspecified deep veins of unspecified lower extremity: Secondary | ICD-10-CM

## 2020-10-16 DIAGNOSIS — O321XX0 Maternal care for breech presentation, not applicable or unspecified: Secondary | ICD-10-CM

## 2020-10-19 ENCOUNTER — Other Ambulatory Visit: Payer: Self-pay | Admitting: *Deleted

## 2020-10-19 DIAGNOSIS — O10913 Unspecified pre-existing hypertension complicating pregnancy, third trimester: Secondary | ICD-10-CM

## 2020-11-13 ENCOUNTER — Ambulatory Visit: Payer: Medicaid Other

## 2020-11-13 ENCOUNTER — Ambulatory Visit: Payer: Medicaid Other | Attending: Obstetrics

## 2020-11-22 NOTE — Progress Notes (Deleted)
Cardiology Office Note:    Date:  11/22/2020   ID:  Candice Holder, DOB 11/15/1985, MRN [id number]  PCP:  Trey Sailors, PA  Cardiologist:  None  Electrophysiologist:  None   Referring MD: Trey Sailors, PA   No chief complaint on file.   History of Present Illness:    Candice Holder is a 35 y.o. female with a hx of hypertension, DVT during prior pregnancy who presents for follow-up.  She was initially seen on 08/24/2020.  At that time she was [redacted] weeks pregnant and referred by Dr. Brien Mates for evaluation for cardiomegaly.  She was diagnosed with DVT during pregnancy in [place] in 2005.  She reports that she was diagnosed with hypertension in 2014 and has been treated on HCTZ.  She reports that echocardiogram in [place] at that time showed possible LV dilatation.  Given her prior DVT with pregnancy, she has been started on Lovenox per OB.  She reports that she has been having chest pain a few times per week.  During episodes reports that it feels like something is sitting on her chest.  It lasts for hours.  It occurs randomly, happens at rest.  Reports has improved since pregnancy started.  Also reports feeling short of breath with minimal exertion.  This was also present prior to pregnancy.  She is also been having palpitations where she feels like heart is racing.  Occurs about twice per week, will last for 30 to 60 minutes.  Sometimes feels lightheaded during episodes.  She denies any syncope.  Reports feeling short of breath during episodes.  Never smoked.  Mother had CHF, reports that she thinks she died of aneurysm but unsure of details.  Brother had CVA at 64.    Echocardiogram on 09/17/2020 showed normal biventricular function, no significant valvular disease.  Zio patch x14 days on 09/29/2020 showed no significant abnormalities.  Since last clinic visit,   Past Medical History:  Diagnosis Date   Hypertension    Obesity    VTE (venous thromboembolism)     Past Surgical  History:  Procedure Laterality Date   CESAREAN SECTION      Current Medications: No outpatient medications have been marked as taking for the 11/24/20 encounter (Appointment) with Donato Heinz, MD.     Allergies:   Shellfish allergy and Iodine   Social History   Socioeconomic History   Marital status: Single    Spouse name: Not on file   Number of children: Not on file   Years of education: Not on file   Highest education level: Not on file  Occupational History   Not on file  Tobacco Use   Smoking status: Never   Smokeless tobacco: Never  Vaping Use   Vaping Use: Never used  Substance and Sexual Activity   Alcohol use: Not Currently    Comment: social   Drug use: No   Sexual activity: Not Currently  Other Topics Concern   Not on file  Social History Narrative   Not on file   Social Determinants of Health   Financial Resource Strain: Not on file  Food Insecurity: Not on file  Transportation Needs: Not on file  Physical Activity: Not on file  Stress: Not on file  Social Connections: Not on file     Family History: The patient's family history includes Arthritis in her maternal aunt, maternal grandmother, and mother; COPD in her maternal aunt and mother; Cancer in her maternal aunt; Diabetes in  her maternal aunt, maternal grandmother, and mother; Hypertension in her brother, maternal aunt, mother, and sister.  ROS:   Please see the history of present illness.     All other systems reviewed and are negative.  EKGs/Labs/Other Studies Reviewed:    The following studies were reviewed today:   EKG:  EKG is ordered today.  The ekg ordered today demonstrates normal sinus rhythm, rate 90, no ST abnormalities, nonspecific T wave flattening  Recent Labs: 09/20/2020: ALT 17; BUN <5; Creatinine, Ser 0.51; Hemoglobin 10.8; Platelets 384; Potassium 4.0; Sodium 136  Recent Lipid Panel No results found for: CHOL, TRIG, HDL, CHOLHDL, VLDL, LDLCALC,  LDLDIRECT  Physical Exam:    VS:  LMP 03/31/2020     Wt Readings from Last 3 Encounters:  10/13/20 280 lb (127 kg)  09/20/20 277 lb 6.4 oz (125.8 kg)  08/24/20 280 lb 12.8 oz (127.4 kg)     GEN: Well nourished, well developed in no acute distress HEENT: Normal NECK: No JVD; No carotid bruits LYMPHATICS: No lymphadenopathy CARDIAC: RRR, no murmurs, rubs, gallops RESPIRATORY:  Clear to auscultation without rales, wheezing or rhonchi  ABDOMEN: Soft, non-tender, non-distended MUSCULOSKELETAL:  No edema; No deformity  SKIN: Warm and dry NEUROLOGIC:  Alert and oriented x 3 PSYCHIATRIC:  Normal affect   ASSESSMENT:    No diagnosis found.  PLAN:    Palpitations: Echocardiogram on 09/17/2020 showed normal biventricular function, no significant valvular disease.  Zio patch x14 days on 09/29/2020 showed no significant abnormalities.  Chest pain: Description suggest noncardiac chest pain, as describes nonexertional pain occurring for hours.  No further cardiac work-up recommended at this time.  Cardiomegaly: LV dilatation noted on prior echocardiogram reportedly in [place] in 2014.  Echocardiogram on 09/17/2020 showed normal biventricular function, no significant valvular disease.    DVT: Occurred during prior pregnancy in 2004.  Started on Lovenox for current pregnancy per OB.  Lower extremity duplex on 10/14/2020 showed no evidence of DVT.  Hypertension: On labetalol 100 mg twice daily.  Appears controlled  RTC in***   Medication Adjustments/Labs and Tests Ordered: Current medicines are reviewed at length with the patient today.  Concerns regarding medicines are outlined above.  No orders of the defined types were placed in this encounter.  No orders of the defined types were placed in this encounter.   There are no Patient Instructions on file for this visit.   Signed, Donato Heinz, MD  11/22/2020 3:57 PM    [place]

## 2020-11-24 ENCOUNTER — Ambulatory Visit: Payer: Medicaid Other | Admitting: Cardiology

## 2020-12-28 ENCOUNTER — Encounter (HOSPITAL_COMMUNITY): Admission: RE | Payer: Self-pay | Source: Home / Self Care

## 2020-12-28 ENCOUNTER — Inpatient Hospital Stay (HOSPITAL_COMMUNITY)
Admission: RE | Admit: 2020-12-28 | Payer: Medicaid Other | Source: Home / Self Care | Admitting: Obstetrics and Gynecology

## 2020-12-28 SURGERY — Surgical Case
Anesthesia: Regional | Laterality: Bilateral

## 2021-04-20 ENCOUNTER — Ambulatory Visit: Payer: Medicaid Other

## 2021-05-13 ENCOUNTER — Encounter (HOSPITAL_COMMUNITY): Payer: Self-pay

## 2021-05-13 ENCOUNTER — Other Ambulatory Visit: Payer: Self-pay

## 2021-05-13 ENCOUNTER — Emergency Department (HOSPITAL_COMMUNITY): Payer: Medicaid Other

## 2021-05-13 ENCOUNTER — Emergency Department (HOSPITAL_COMMUNITY)
Admission: EM | Admit: 2021-05-13 | Discharge: 2021-05-13 | Disposition: A | Discharge status: Home / Self Care | Payer: Medicaid Other | Attending: Emergency Medicine | Admitting: Emergency Medicine

## 2021-05-13 ENCOUNTER — Emergency Department (HOSPITAL_BASED_OUTPATIENT_CLINIC_OR_DEPARTMENT_OTHER)
Admit: 2021-05-13 | Discharge: 2021-05-13 | Disposition: A | Discharge status: Home / Self Care | Payer: Medicaid Other | Attending: Emergency Medicine | Admitting: Emergency Medicine

## 2021-05-13 DIAGNOSIS — M79604 Pain in right leg: Secondary | ICD-10-CM

## 2021-05-13 DIAGNOSIS — I10 Essential (primary) hypertension: Secondary | ICD-10-CM | POA: Diagnosis not present

## 2021-05-13 DIAGNOSIS — Z86711 Personal history of pulmonary embolism: Secondary | ICD-10-CM | POA: Insufficient documentation

## 2021-05-13 DIAGNOSIS — R0602 Shortness of breath: Secondary | ICD-10-CM | POA: Diagnosis not present

## 2021-05-13 DIAGNOSIS — M79661 Pain in right lower leg: Secondary | ICD-10-CM | POA: Insufficient documentation

## 2021-05-13 DIAGNOSIS — Z7982 Long term (current) use of aspirin: Secondary | ICD-10-CM | POA: Insufficient documentation

## 2021-05-13 DIAGNOSIS — Z79899 Other long term (current) drug therapy: Secondary | ICD-10-CM | POA: Insufficient documentation

## 2021-05-13 DIAGNOSIS — R079 Chest pain, unspecified: Secondary | ICD-10-CM

## 2021-05-13 DIAGNOSIS — R0789 Other chest pain: Secondary | ICD-10-CM | POA: Insufficient documentation

## 2021-05-13 LAB — BASIC METABOLIC PANEL
Anion gap: 5 (ref 5–15)
BUN: 14 mg/dL (ref 6–20)
CO2: 28 mmol/L (ref 22–32)
Calcium: 8.6 mg/dL — ABNORMAL LOW (ref 8.9–10.3)
Chloride: 105 mmol/L (ref 98–111)
Creatinine, Ser: 0.85 mg/dL (ref 0.44–1.00)
GFR, Estimated: >60 mL/min (ref >60)
Glucose, Bld: 105 mg/dL — ABNORMAL HIGH (ref 70–99)
Potassium: 3.5 mmol/L (ref 3.5–5.1)
Sodium: 138 mmol/L (ref 135–145)

## 2021-05-13 LAB — CBC
HCT: 39.4 % (ref 36.0–46.0)
Hemoglobin: 13.1 g/dL (ref 12.0–15.0)
MCH: 30.3 pg (ref 26.0–34.0)
MCHC: 33.2 g/dL (ref 30.0–36.0)
MCV: 91.2 fL (ref 80.0–100.0)
Platelets: 484 10*3/uL — ABNORMAL HIGH (ref 150–400)
RBC: 4.32 MIL/uL (ref 3.87–5.11)
RDW: 13.9 % (ref 11.5–15.5)
WBC: 10.1 10*3/uL (ref 4.0–10.5)
nRBC: 0 % (ref 0.0–0.2)

## 2021-05-13 LAB — TROPONIN I (HIGH SENSITIVITY)
Troponin I (High Sensitivity): 2 ng/L (ref <18)
Troponin I (High Sensitivity): 2 ng/L (ref <18)

## 2021-05-13 LAB — I-STAT BETA HCG BLOOD, ED (MC, WL, AP ONLY): I-stat hCG, quantitative: <5 m[IU]/mL (ref <5)

## 2021-05-13 MED ORDER — IOHEXOL 350 MG/ML SOLN
100.0000 mL | Freq: Once | INTRAVENOUS | Status: AC | PRN
  Administered 2021-05-13: 80 mL via INTRAVENOUS

## 2021-05-13 NOTE — Discharge Instructions (Addendum)
You are seen in the emergency department for chest pain.  As we discussed we performed an ultrasound of your leg, scan of your lungs, as well as several of blood tests that all came back normal.  That being said, we saw no evidence of blood clot or any strain on your heart.  I'd like you to follow-up with your primary doctor regarding your hypertension management.  You can try taking ibuprofen or Tylenol as needed for pain.  Continue to monitor how you're doing and return to the ER for new or worsening symptoms such as fevers, chills, or if your pain changes in any way.   It has been a pleasure seeing and caring for you today and I hope you start feeling better soon!

## 2021-05-13 NOTE — ED Provider Notes (Signed)
Emergency Medicine Provider Triage Evaluation Note  Candice Holder , a 35 y.o. female  was evaluated in triage.  Pt complains of chest pain.  Initially it started out as chest pressure about a week ago, 2 days ago she started having intermittent sharp pain when she breathes.  The pressure is still constant, the pain is new.  It does not radiate to the back or the arm, no history of nausea or vomiting.  Feels like she cannot breathe when the sharp pain starts.    History of 2 prior PEs, was on Lovenox but discontinued after C-section in July 2022. Marland Kitchen  Review of Systems  Positive: Chest pain, shortness of breath Negative: Nausea, vomiting  Physical Exam  BP (!) 157/92 (BP Location: Right Arm)   Pulse 74   Temp 97.9 F (36.6 C) (Oral)   Resp 13   SpO2 97%  Gen:   Awake, no distress   Resp:  Normal effort  MSK:   Moves extremities without difficulty  Other:  S1-S2  Medical Decision Making  Medically screening exam initiated at 2:08 PM.  Appropriate orders placed.  Candice Holder was informed that the remainder of the evaluation will be completed by another provider, this initial triage assessment does not replace that evaluation, and the importance of remaining in the ED until their evaluation is complete.  Chest pain work-up.  She is not tachycardic or hypoxic, PE is a consideration given history and pleuritic chest pain.      Sherrill Raring, PA-C 05/13/21 Anthony, Julie, MD 05/13/21 [phone]

## 2021-05-13 NOTE — Progress Notes (Signed)
Right lower extremity venous duplex has been completed. Preliminary results can be found in CV Proc through chart review.  Results were given to Dr. Gilford Raid.  05/13/21 4:40 PM Candice Holder RVT

## 2021-05-13 NOTE — ED Triage Notes (Signed)
Patient arrives via ems from home. Pt was told by her pcp to have troponin levels checked due to her having ongoing chest pain. EKGs looked normal at doctor's office.   Pt states pain has been constant over the past week. Reports sob with exertion.

## 2021-05-13 NOTE — ED Provider Notes (Signed)
[facility] DEPT Provider Note   CSN: [id number] Arrival date & time: 05/13/21  1347     History Chief Complaint  Patient presents with   Chest Pain    Candice Holder is a 35 y.o. female with history of HTN who presents to ED with complaint of chest pain x 1 week.  Patient reports that she was seen by her PCP earlier today, and they discussed changing her hypertension regimen.  However due to concerning chest pain, they advise she follow-up in the emergency department for serial troponins. Patient states that her symptoms initially started with generalized chest pressure about a week ago, and has since developed intermittent sharp pains around her bra line about 3 days ago.  She states that she feels the sharp pains mostly when she is deep breathing.  However she is still complaining of constant pressure, and is intermittently radiates to her back.  She also endorses dyspnea on exertion for the past several days.  Patient also reports intermittent sharp pains in her right lower leg, that feels similarly to when she had a blood clot before.  Patient states that she has a history of DVT back in 2005, and PE in 2016.  She was on Lovenox, but this was discontinued after she had a C-section back in July 2022.    Chest Pain Associated symptoms: shortness of breath   Associated symptoms: no abdominal pain, no cough, no fever, no nausea, no numbness and no vomiting       Past Medical History:  Diagnosis Date   Hypertension    Obesity    VTE (venous thromboembolism)     There are no problems to display for this patient.   Past Surgical History:  Procedure Laterality Date   CESAREAN SECTION       OB History     Gravida  4   Para  1   Term  1   Preterm      AB  2   Living  1      SAB  1   IAB  1   Ectopic      Multiple      Live Births              Family History  Problem Relation Age of Onset   Arthritis Mother    COPD Mother     Diabetes Mother    Hypertension Mother    Hypertension Sister    Hypertension Brother    Arthritis Maternal Aunt    Cancer Maternal Aunt    COPD Maternal Aunt    Diabetes Maternal Aunt    Hypertension Maternal Aunt    Arthritis Maternal Grandmother    Diabetes Maternal Grandmother     Social History   Tobacco Use   Smoking status: Never   Smokeless tobacco: Never  Vaping Use   Vaping Use: Never used  Substance Use Topics   Alcohol use: Not Currently    Comment: social   Drug use: No    Home Medications Prior to Admission medications   Medication Sig Start Date End Date Taking? Authorizing Provider  aspirin EC 81 MG tablet Take 81 mg by mouth daily. Swallow whole.    [provider]  Enoxaparin Sodium (LOVENOX [place]) Inject into the skin.    [provider]  famotidine (PEPCID) 20 MG tablet Take 20 mg by mouth at bedtime.    [provider]  labetalol (NORMODYNE) 100 MG tablet Take 100  mg by mouth daily.    [provider]  Prenatal Vit-Fe Fumarate-FA (PRENATAL MULTIVITAMIN) TABS tablet Take 1 tablet by mouth daily at 12 noon.    [provider]    Allergies    Shellfish allergy and Iodine  Review of Systems   Review of Systems  Constitutional:  Negative for chills and fever.  HENT:  Negative for congestion.   Respiratory:  Positive for shortness of breath. Negative for cough.   Cardiovascular:  Positive for chest pain.  Gastrointestinal:  Negative for abdominal pain, nausea and vomiting.  Musculoskeletal:        Right lower leg pain  Neurological:  Negative for numbness.  All other systems reviewed and are negative.  Physical Exam Updated Vital Signs BP (!) 165/106 (BP Location: Right Arm)   Pulse 71   Temp 98.2 F (36.8 C) (Oral)   Resp 18   SpO2 100%   Physical Exam Vitals and nursing note reviewed.  Constitutional:      Appearance: Normal appearance.  HENT:     Head: Normocephalic and atraumatic.  Eyes:      Conjunctiva/sclera: Conjunctivae normal.  Cardiovascular:     Rate and Rhythm: Normal rate and regular rhythm.  Pulmonary:     Effort: Pulmonary effort is normal. No respiratory distress.     Breath sounds: Normal breath sounds.  Abdominal:     General: There is no distension.     Palpations: Abdomen is soft.     Tenderness: There is no abdominal tenderness.  Musculoskeletal:     Right lower leg: No tenderness. No edema.     Left lower leg: No tenderness. No edema.  Skin:    General: Skin is warm and dry.  Neurological:     General: No focal deficit present.     Mental Status: She is alert.    ED Results / Procedures / Treatments   Labs (all labs ordered are listed, but only abnormal results are displayed) Labs Reviewed  BASIC METABOLIC PANEL - Abnormal; Notable for the following components:      Result Value   Glucose, Bld 105 (*)    Calcium 8.6 (*)    All other components within normal limits  CBC - Abnormal; Notable for the following components:   Platelets 484 (*)    All other components within normal limits  I-STAT BETA HCG BLOOD, ED ([facility], [facility], [facility] ONLY)  TROPONIN I (HIGH SENSITIVITY)  TROPONIN I (HIGH SENSITIVITY)    EKG EKG Interpretation  Date/Time:  Thursday May 13 2021 13:56:12 EST Ventricular Rate:  78 PR Interval:  140 QRS Duration: 86 QT Interval:  397 QTC Calculation: 453 R Axis:   -9 Text Interpretation: Sinus rhythm LVH by voltage Borderline T abnormalities, lateral leads No significant change since prior 4/22 Confirmed by Aletta Edouard [phone]) on 05/13/2021 3:39:49 PM  Radiology DG Chest 2 View  Result Date: 05/13/2021 CLINICAL DATA:  Chest pain and pressure for 2 weeks. EXAM: CHEST - 2 VIEW COMPARISON:  09/21/2020 FINDINGS: The heart size and mediastinal contours are within normal limits. Both lungs are clear. The visualized skeletal structures are unremarkable. IMPRESSION: No active cardiopulmonary disease. Electronically Signed   By:  Marlaine Hind M.D.   On: 05/13/2021 14:37   CT Angio Chest PE W and/or Wo Contrast  Result Date: 05/13/2021 CLINICAL DATA:  Chest pain and shortness of breath for 3 days. EXAM: CT ANGIOGRAPHY CHEST WITH CONTRAST TECHNIQUE: Multidetector CT imaging of the chest was performed using  the standard protocol during bolus administration of intravenous contrast. Multiplanar CT image reconstructions and MIPs were obtained to evaluate the vascular anatomy. CONTRAST:  65mL OMNIPAQUE IOHEXOL 350 MG/ML SOLN COMPARISON:  11/12/2017 FINDINGS: Cardiovascular: Satisfactory opacification of the pulmonary arteries to the segmental level. No evidence of pulmonary embolism. Normal heart size. No pericardial effusion. Mediastinum/Nodes: No enlarged mediastinal, hilar, or axillary lymph nodes. Thyroid gland, trachea, and esophagus demonstrate no significant findings. Lungs/Pleura: No pleural effusion, airspace consolidation, atelectasis or pneumothorax. Calcified granuloma noted in the right middle lobe. Upper Abdomen: No acute abnormality. Musculoskeletal: No chest wall abnormality. No acute or significant osseous findings. Review of the MIP images confirms the above findings. IMPRESSION: Negative exam. No evidence for acute pulmonary embolus. Electronically Signed   By: Kerby Moors M.D.   On: 05/13/2021 17:34   VAS [place] LOWER EXTREMITY VENOUS (DVT) (7a-7p)  Result Date: 05/13/2021  Lower Venous DVT Study Patient Name:  Danielys Babineau  Date of Exam:   05/13/2021 Medical Rec #: [id number]      Accession #:    [id number] Date of Birth: [birth year]       Patient Gender: F Patient Age:   24 years Exam Location:  [facility] Procedure:      VAS [place] LOWER EXTREMITY VENOUS (DVT) Referring Phys: Audrie Gallus Philamena Kramar --------------------------------------------------------------------------------  Indications: Pain.  Risk Factors: None identified. Limitations: Body habitus and poor ultrasound/tissue interface. Comparison Study: No prior  studies. Performing Technologist: Oliver Hum RVT  Examination Guidelines: A complete evaluation includes B-mode imaging, spectral Doppler, color Doppler, and power Doppler as needed of all accessible portions of each vessel. Bilateral testing is considered an integral part of a complete examination. Limited examinations for reoccurring indications may be performed as noted. The reflux portion of the exam is performed with the patient in reverse Trendelenburg.  +---------+---------------+---------+-----------+----------+--------------+ RIGHT    CompressibilityPhasicitySpontaneityPropertiesThrombus Aging +---------+---------------+---------+-----------+----------+--------------+ CFV      Full           Yes      Yes                                 +---------+---------------+---------+-----------+----------+--------------+ SFJ      Full                                                        +---------+---------------+---------+-----------+----------+--------------+ FV Prox  Full                                                        +---------+---------------+---------+-----------+----------+--------------+ FV Mid   Full                                                        +---------+---------------+---------+-----------+----------+--------------+ FV DistalFull                                                        +---------+---------------+---------+-----------+----------+--------------+  PFV      Full                                                        +---------+---------------+---------+-----------+----------+--------------+ POP      Full           Yes      Yes                                 +---------+---------------+---------+-----------+----------+--------------+ PTV      Full                                                        +---------+---------------+---------+-----------+----------+--------------+ PERO     Full                                                         +---------+---------------+---------+-----------+----------+--------------+   +----+---------------+---------+-----------+----------+--------------+ LEFTCompressibilityPhasicitySpontaneityPropertiesThrombus Aging +----+---------------+---------+-----------+----------+--------------+ CFV Full           Yes      Yes                                 +----+---------------+---------+-----------+----------+--------------+    Summary: RIGHT: - There is no evidence of deep vein thrombosis in the lower extremity. However, portions of this examination were limited- see technologist comments above.  - No cystic structure found in the popliteal fossa.  LEFT: - No evidence of common femoral vein obstruction.  *See table(s) above for measurements and observations. Electronically signed by Monica Martinez MD on 05/13/2021 at 5:27:36 PM.    Final     Procedures Procedures   Medications Ordered in ED Medications  iohexol (OMNIPAQUE) 350 MG/ML injection 100 mL (80 mLs Intravenous Contrast Given 05/13/21 1714)    ED Course  I have reviewed the triage vital signs and the nursing notes.  Pertinent labs & imaging results that were available during my care of the patient were reviewed by me and considered in my medical decision making (see chart for details).    MDM Rules/Calculators/A&P                           Patient is 35 year old female with history of hypertension and venous thromboembolism who presents to the emergency department for approximately 1 week of chest pressure and right lower leg pain. Hx DVT 2005 and PE 2016, was on Lovenox but this was discontinued in July 2022.   On exam patient is afebrile, not tachycardic, not hypoxic, and in no acute distress.  Lung sounds are clear to auscultation all fields, and cardiac auscultation is normal.  Patient complaining of right lower leg pain, there is no obvious tenderness or swelling.  Normal and equal  pulses in bilateral lower extremities.  With concerning history of VTE, will obtain lab work as well as imaging to rule out DVT  and PE.  Chest pain is not likely of cardiac or pulmonary etiology d/t presentation, VSS, no tracheal deviation, no JVD or new murmur, RRR, breath sounds equal bilaterally, EKG without acute abnormalities, negative troponin, and negative CXR. Negative ultrasound for DVT and negative CTA for PE. Heart score for major cardiac events = 2. Patient is to be discharged with recommendation to follow up with PCP in regards to today's hospital visit. Pt has been advised to return to the ED if CP becomes exertional, associated with diaphoresis or nausea, radiates to left jaw/arm, worsens or becomes concerning in any way. Pt appears reliable for follow up and is agreeable to discharge.   Final Clinical Impression(s) / ED Diagnoses Final diagnoses:  Nonspecific chest pain  History of pulmonary embolism    Rx / DC Orders ED Discharge Orders     None        Estill Cotta 05/13/21 1831    Hayden Rasmussen, MD 05/14/21 [phone]

## 2021-10-15 IMAGING — US US MFM OB FOLLOW-UP
1 series · 13 of 28 positions shown · non-contrast
Comparison: none

[Series 1: us mfm ob follow-up · 60 acquisitions, 13 frames shown]
[im 3/60]
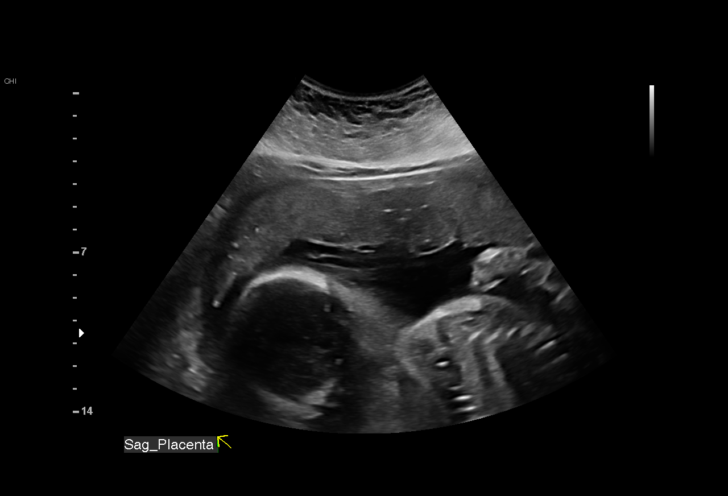
[im 7/60]
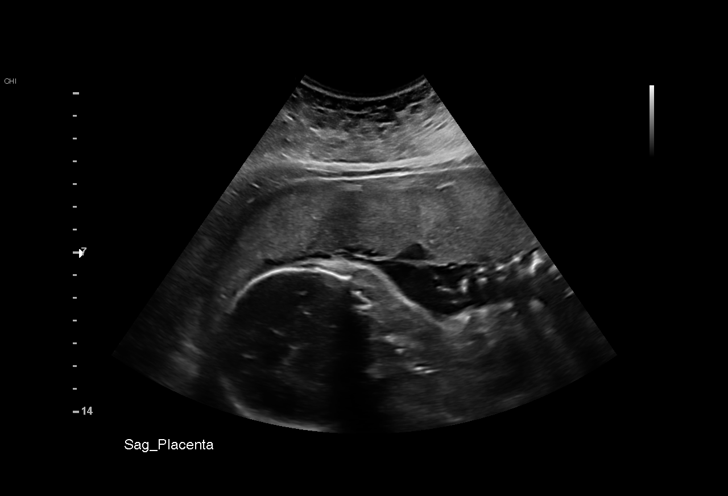
[im 11/60]
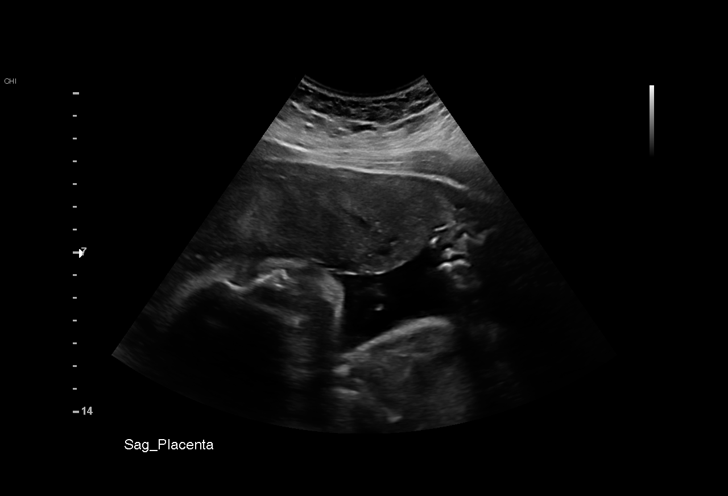
[im 16/60]
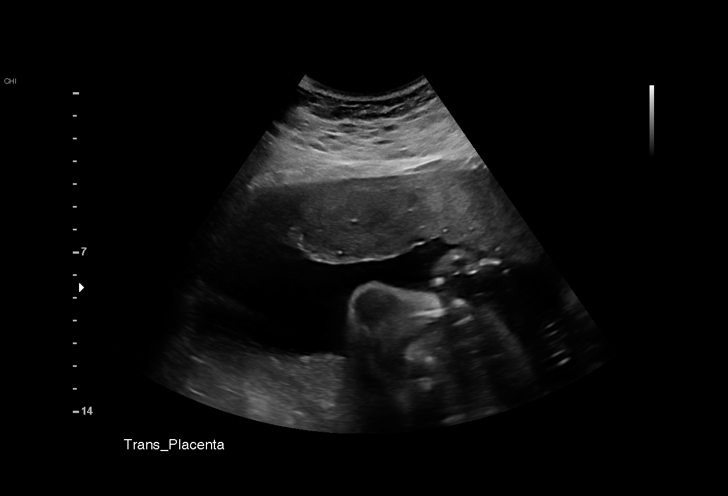
[im 20/60]
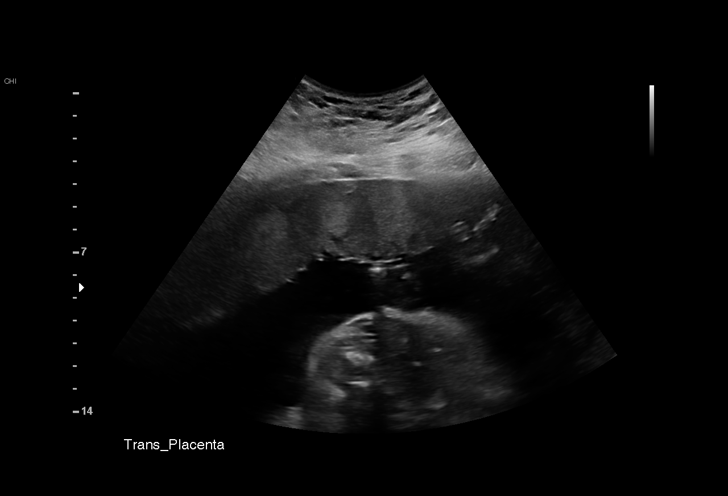
[im 25/60]
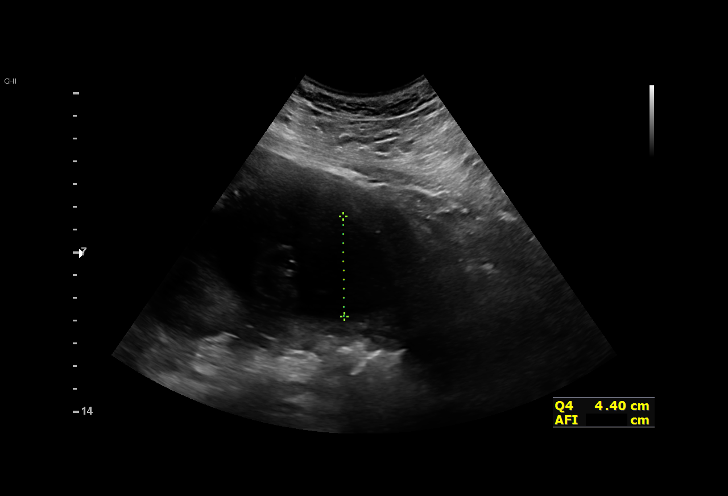
[im 31/60]
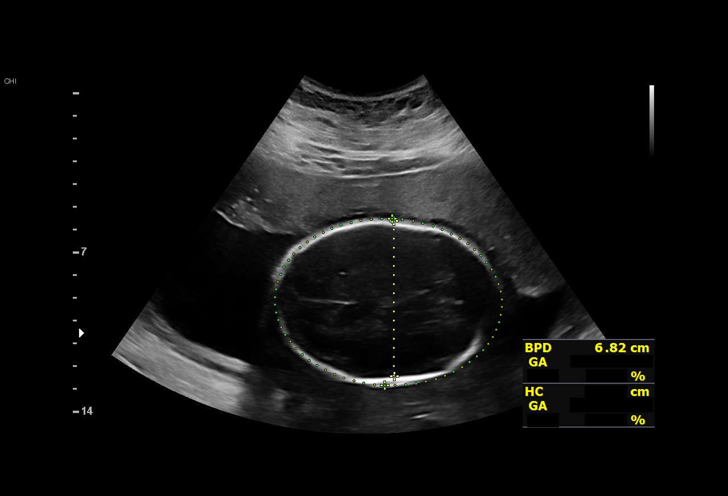
[im 35/60]
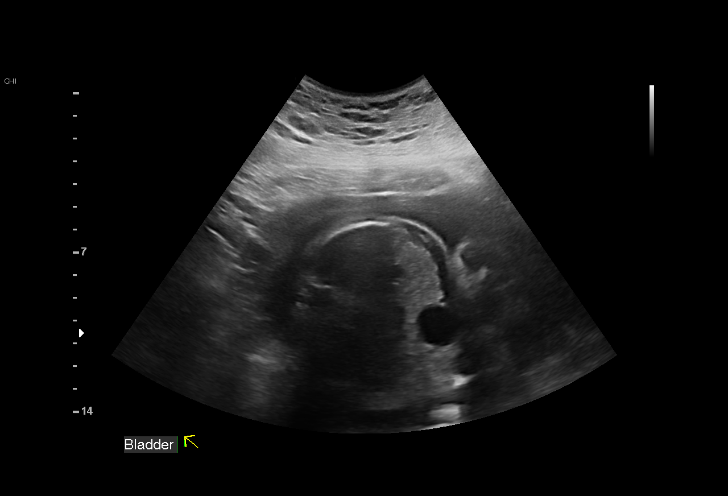
[im 40/60]
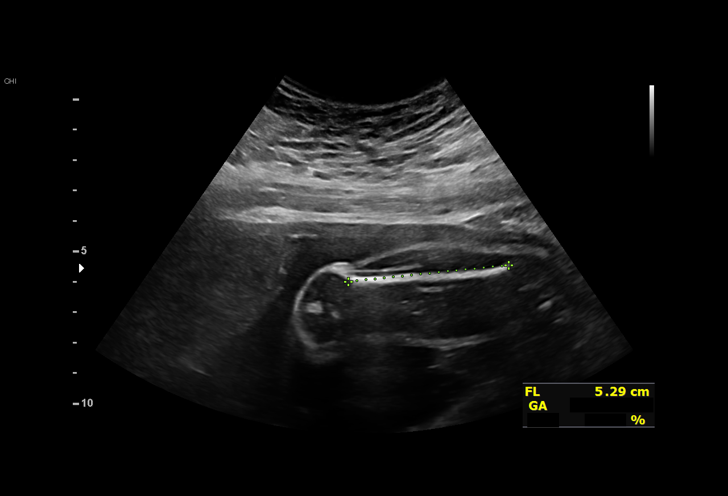
[im 44/60]
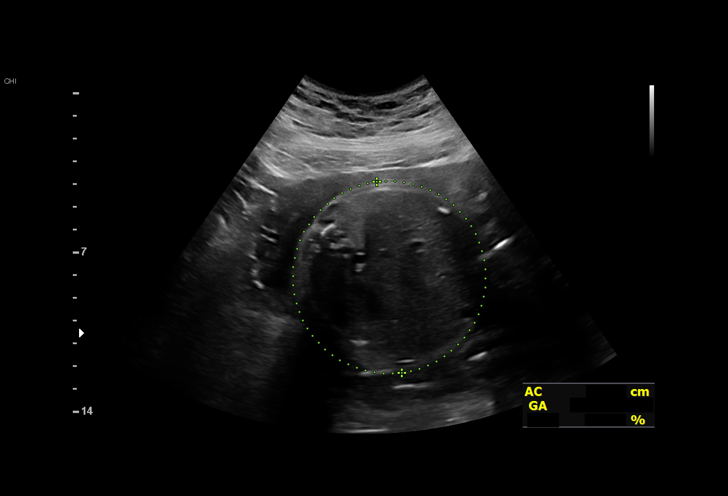
[im 49/60]
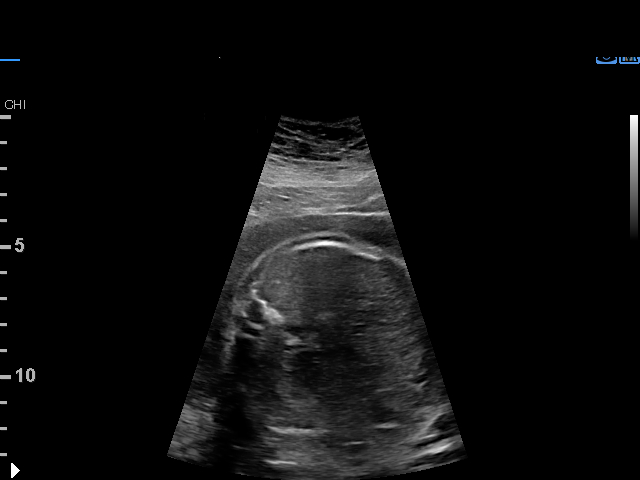
[im 53/60]
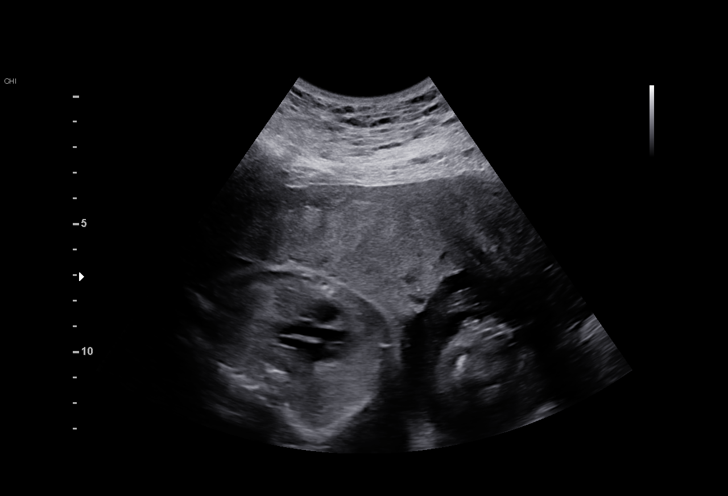
[im 57/60]
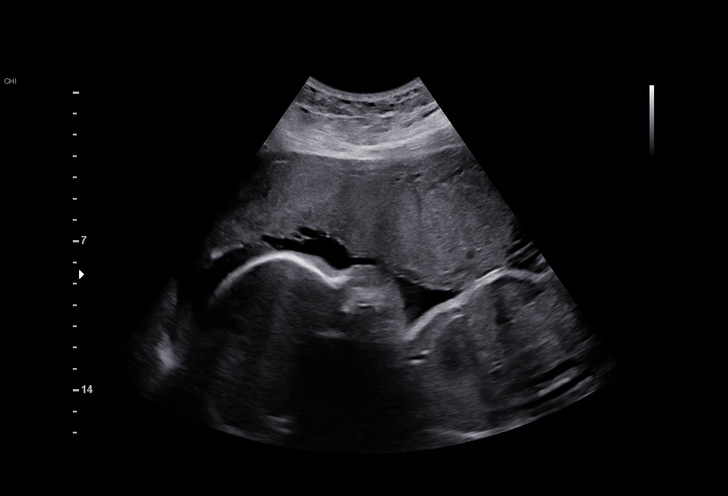

[13 of 28 positions shown; findings below may reference images not displayed]

OBGYN
                                                            [REDACTED]
                   M[facility]I

Indications

 Hypertension - Chronic/Pre-existing
 (labetalol)
 Antenatal screening for malformations
 Obesity complicating pregnancy, second
 trimester (pregravid BMI 47)
 Poor obstetric history: Previous
 preeclampsia / eclampsia/gestational HTN
 Advanced maternal age multigravida 35+,
 second trimester
 Previous cesarean delivery, antepartum
 Medical complication of pregnancy (DVT)
 28 weeks gestation of pregnancy
Fetal Evaluation

 Num Of Fetuses:         1
 Fetal Heart Rate(bpm):  150
 Cardiac Activity:       Observed
 Presentation:           Breech
 Placenta:               Anterior
 P. Cord Insertion:      Previously Visualized

 Amniotic Fluid
 AFI FV:      Within normal limits

 AFI Sum(cm)     %Tile       Largest Pocket(cm)
 19.76           78
 RUQ(cm)       RLQ(cm)       LUQ(cm)        LLQ(cm)

Biometry

 BPD:      68.4  mm     G. Age:  27w 4d         11  %    CI:        63.46   %    70 - 86
                                                         FL/HC:      19.3   %    19.6 -
 HC:      276.6  mm     G. Age:  30w 2d         70  %    HC/AC:      1.05        0.99 -
 AC:       263   mm     G. Age:  30w 3d         90  %    FL/BPD:     78.1   %    71 - 87
 FL:       53.4  mm     G. Age:  28w 2d         28  %    FL/AC:      20.3   %    20 - 24

 LV:        5.3  mm

 Est. FW:    1027  gm      3 lb 2 oz     73  %
OB History

 Gravidity:    4         Term:   1        Prem:   0        SAB:   1
 TOP:          1       Ectopic:  0        Living: 1
Gestational Age

 LMP:           28w 4d        Date:  03/30/20                 EDD:   01/04/21
 U/S Today:     29w 1d                                        EDD:   12/31/20
 Best:          28w 4d     Det. By:  LMP  (03/30/20)          EDD:   01/04/21
Anatomy

 Cranium:               Appears normal         Aortic Arch:            Previously seen
 Cavum:                 Appears normal         Ductal Arch:            Previously seen
 Ventricles:            Appears normal         Diaphragm:              Previously seen
 Choroid Plexus:        Previously seen        Stomach:                Appears normal, left
                                                                       sided
 Cerebellum:            Previously seen        Abdomen:                Appears normal
 Posterior Fossa:       Previously seen        Abdominal Wall:         Previously seen
 Face:                  Orbits and profile     Cord Vessels:           Previously seen
                        previously seen
 Lips:                  Previously seen        Kidneys:                Appear normal
 Thoracic:              Appears normal         Bladder:                Appears normal
 Heart:                 Appears normal         Spine:                  Previously seen
                        (4CH, axis, and
                        situs)
 RVOT:                  Previously seen        Upper Extremities:      Previously seen
 LVOT:                  Previously seen        Lower Extremities:      Previously seen

 Other:  Left hand polydactyly prev vis. Nasal bone previously visualized.
         Previously fetus appears to be a male. VC, 3VV visualized.
Cervix Uterus Adnexa

 Cervix
 Not visualized (advanced GA >88wks)
Comments

 This patient was seen for a follow up growth scan due to
 chronic hypertension currently treated with labetalol.  Her
 blood pressures in our office today were 134/97 and 137/88.
 She remains on prophylactic Lovenox due to a history of a
 prior DVT.  She denies any problems since her last exam.
 She was informed that the fetal growth and amniotic fluid
 level appears appropriate for her gestational age.
 A follow up exam was scheduled in 4 weeks.
 Due to her underlying medical conditions, weekly fetal testing
 in your office should be started at around 32 weeks.

## 2022-03-27 ENCOUNTER — Other Ambulatory Visit: Payer: Self-pay | Admitting: Oncology

## 2022-03-27 DIAGNOSIS — D473 Essential (hemorrhagic) thrombocythemia: Secondary | ICD-10-CM

## 2022-03-28 ENCOUNTER — Encounter: Payer: Medicaid Other | Admitting: Oncology

## 2022-03-28 ENCOUNTER — Inpatient Hospital Stay: Payer: Medicaid Other | Attending: Oncology

## 2022-03-28 DIAGNOSIS — D75839 Thrombocytosis, unspecified: Secondary | ICD-10-CM | POA: Insufficient documentation

## 2022-04-10 ENCOUNTER — Other Ambulatory Visit: Payer: Self-pay | Admitting: Oncology

## 2022-04-10 DIAGNOSIS — D473 Essential (hemorrhagic) thrombocythemia: Secondary | ICD-10-CM

## 2022-04-10 NOTE — Progress Notes (Signed)
[facility]  [address] [place],  [place]  [address] [phone]  Clinic Day:  04/11/2022  Referring physician: Trey Sailors, PA   HISTORY OF PRESENT ILLNESS:  The patient is a 36 y.o. female who I was asked to consult upon for persistent thrombocythemia.  Recent labs at her primary care office showed an elevated platelet count of 474.  According to the patient, these labs were done as part of her routine medical exam.  She denies ever having any previous problems with elevated platelets.  She denies having any recent illnesses or infections which could have reactively led to an elevated platelet count.  She also denies having undergone a previous splenectomy.  She also denies having a history of iron deficiency, which can cause a reactive thrombocythemia.  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  She claims her first DVT developed while pregnant in 2005.  She was given Lovenox up until the delivery of her child.  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  In 2008, she apparently had a small pulmonary embolus for which she was briefly placed on a blood thinner.  Due to other medications she was on at that time, an apparent allergic reaction developed which led to her coming off of her blood thinner.  However, she readily admits she was not enamored with being on a blood thinner and having to potentially take one lifelong.  The patient claims she has not had any further blood clots since 2008.  She claims her mother and maternal aunt have also had issues with blood clots.  PAST MEDICAL HISTORY:   Past Medical History:  Diagnosis Date   Anxiety    Depression    Hypertension    Obesity    VTE (venous thromboembolism)     PAST SURGICAL HISTORY:   Past Surgical History:  Procedure Laterality Date   CESAREAN  SECTION     X 2    CURRENT MEDICATIONS:   Current Outpatient Medications  Medication Sig Dispense Refill   atenolol (TENORMIN) 25 MG tablet Take by mouth daily.     buPROPion (WELLBUTRIN) 100 MG tablet Take 100 mg by mouth 2 (two) times daily.     citalopram (CELEXA) 20 MG tablet Take 20 mg by mouth daily.     meloxicam (MOBIC) 15 MG tablet Take 15 mg by mouth daily.     tiZANidine (ZANAFLEX) 4 MG capsule Take 4 mg by mouth 3 (three) times daily.     aspirin EC 81 MG tablet Take 81 mg by mouth daily. Swallow whole.     No current facility-administered medications for this visit.    ALLERGIES:   Allergies  Allergen Reactions   Shellfish Allergy Hives and Swelling   Iodine Hives    betadine    FAMILY HISTORY:   Family History  Problem Relation Age of Onset   Arthritis Mother    COPD Mother    Diabetes Mother    Hypertension Mother    Heart disease Mother    Cerebral aneurysm Mother    Arthritis Maternal Aunt    Ovarian cancer Maternal Aunt    COPD Maternal Aunt    Diabetes Maternal Aunt    Hypertension Maternal Aunt    Arthritis Maternal Grandmother    Diabetes Maternal Grandmother  Hypertension Brother    CVA Brother    Hypertension Brother    Hypertension Half-Brother    Hypertension Half-Brother    Hypertension Sister    Hypertension Sister    Hypertension Sister    Hypertension Sister    Renal Disease Sister    Hypertension Half-Sister    Hypertension Half-Sister    Crohn's disease Half-Sister    Irritable bowel syndrome Half-Sister    Hypertension Half-Sister     SOCIAL HISTORY:  The patient was born and raised in [place], [place].  She currently lives in town.  She is single, with 2 children.  She is currently unemployed; she previously did warehouse work.  She denies a history of tobacco use, but drinks alcohol occasionally.  REVIEW OF SYSTEMS:  Review of Systems  Constitutional:  Negative for fatigue and fever.  HENT:   Negative for hearing  loss and sore throat.   Eyes:  Positive for eye problems.  Respiratory:  Positive for chest tightness. Negative for cough and hemoptysis.   Cardiovascular:  Negative for chest pain and palpitations.  Gastrointestinal:  Negative for abdominal distention, abdominal pain, blood in stool, constipation, diarrhea, nausea and vomiting.  Endocrine: Negative for hot flashes.  Genitourinary:  Negative for difficulty urinating, dysuria, frequency, hematuria and nocturia.   Musculoskeletal:  Positive for back pain and myalgias. Negative for arthralgias and gait problem.  Skin:  Positive for rash (bilateral cheeks). Negative for itching.  Neurological: Negative.  Negative for dizziness, extremity weakness, gait problem, headaches, light-headedness and numbness.  Hematological: Negative.   Psychiatric/Behavioral:  Positive for depression. Negative for suicidal ideas. The patient is nervous/anxious.      PHYSICAL EXAM:  Blood pressure (!) 180/90, pulse 74, temperature 98.4 F (36.9 C), resp. rate 16, height '5\' 4"'$  (1.626 m), weight 282 lb 12.8 oz (128.3 kg), SpO2 96 %, unknown if currently breastfeeding. Wt Readings from Last 3 Encounters:  04/11/22 282 lb 12.8 oz (128.3 kg)  10/13/20 280 lb (127 kg)  09/20/20 277 lb 6.4 oz (125.8 kg)   Body mass index is 48.54 kg/m. Performance status (ECOG): 0 - Asymptomatic Physical Exam Constitutional:      Appearance: Normal appearance. She is obese. She is not ill-appearing.  HENT:     Mouth/Throat:     Mouth: Mucous membranes are moist.     Pharynx: Oropharynx is clear. No oropharyngeal exudate or posterior oropharyngeal erythema.  Cardiovascular:     Rate and Rhythm: Normal rate and regular rhythm.     Heart sounds: No murmur heard.    No friction rub. No gallop.  Pulmonary:     Effort: Pulmonary effort is normal. No respiratory distress.     Breath sounds: Normal breath sounds. No wheezing, rhonchi or rales.  Abdominal:     General: Bowel sounds are  normal. There is no distension.     Palpations: Abdomen is soft. There is no mass.     Tenderness: There is no abdominal tenderness.  Musculoskeletal:        General: No swelling.     Right lower leg: No edema.     Left lower leg: No edema.  Lymphadenopathy:     Cervical: No cervical adenopathy.     Upper Body:     Right upper body: No supraclavicular or axillary adenopathy.     Left upper body: No supraclavicular or axillary adenopathy.     Lower Body: No right inguinal adenopathy. No left inguinal adenopathy.  Skin:    General:  Skin is warm.     Coloration: Skin is not jaundiced.     Findings: No lesion or rash.  Neurological:     General: No focal deficit present.     Mental Status: She is alert and oriented to person, place, and time. Mental status is at baseline.  Psychiatric:        Mood and Affect: Mood normal.        Behavior: Behavior normal.        Thought Content: Thought content normal.    LABS:       Latest Ref Rng & Units 05/13/2021    3:10 PM 09/20/2020   11:11 PM 05/19/2020    9:10 PM  CBC  WBC 4.0 - 10.5 K/uL 10.1  9.8  9.2   Hemoglobin 12.0 - 15.0 g/dL 13.1  10.8  12.1   Hematocrit 36.0 - 46.0 % 39.4  32.0  35.2   Platelets 150 - 400 K/uL 484  384  442       Latest Ref Rng & Units 05/13/2021    3:10 PM 09/20/2020   11:11 PM 10/09/2018    2:19 PM  CMP  Glucose 70 - 99 mg/dL 105  87  104   BUN 6 - 20 mg/dL 14  <5  9   Creatinine 0.44 - 1.00 mg/dL 0.85  0.51  0.65   Sodium 135 - 145 mmol/L 138  136  140   Potassium 3.5 - 5.1 mmol/L 3.5  4.0  3.7   Chloride 98 - 111 mmol/L 105  107  106   CO2 22 - 32 mmol/L '28  23  24   '$ Calcium 8.9 - 10.3 mg/dL 8.6  9.3  9.4   Total Protein 6.5 - 8.1 g/dL  6.2    Total Bilirubin 0.3 - 1.2 mg/dL  0.4    Alkaline Phos 38 - 126 U/L  58    AST 15 - 41 U/L  16    ALT 0 - 44 U/L  17     ASSESSMENT & PLAN:  A 36 y.o. female who I was asked to consult upon for thrombocythemia.  Based upon her recent history, there is  nothing to suggest her elevated platelet count is a reactive process from an underlying illness or infection. As her hemoglobin is borderline low, I will check her iron parameters today to ensure her elevated platelet count is not a reactive response to iron deficiency.  I will also have JAK2 mutation testing done to ensure her elevated platelet count is not due to to underlying essential thrombocythemia.  Overall, I am pleased that her platelet count has fallen from previously higher levels.  I do not get the sense an ominous hematologic process is present.  As she is clinically doing well, she will be followed conservatively for now.  I will see her back in 1 month to go over all of her labs collected today, as well as to reassess her thrombocythemia at that time.  The patient understands all the plans discussed today and is in agreement with them.  I do appreciate Trey Sailors, PA for his new consult.   Andrienne Havener Macarthur Critchley, MD

## 2022-04-11 ENCOUNTER — Other Ambulatory Visit: Payer: Self-pay | Admitting: Oncology

## 2022-04-11 ENCOUNTER — Encounter: Payer: Self-pay | Admitting: Oncology

## 2022-04-11 ENCOUNTER — Inpatient Hospital Stay: Payer: Medicaid Other

## 2022-04-11 ENCOUNTER — Inpatient Hospital Stay (INDEPENDENT_AMBULATORY_CARE_PROVIDER_SITE_OTHER): Payer: Medicaid Other | Admitting: Oncology

## 2022-04-11 DIAGNOSIS — D75839 Thrombocytosis, unspecified: Secondary | ICD-10-CM | POA: Diagnosis present

## 2022-04-11 DIAGNOSIS — D473 Essential (hemorrhagic) thrombocythemia: Secondary | ICD-10-CM

## 2022-04-11 LAB — CBC: RBC: 4.01 (ref 3.87–5.11)

## 2022-04-11 LAB — HEPATIC FUNCTION PANEL
ALT: 17 U/L (ref 7–35)
AST: 18 (ref 13–35)
Alkaline Phosphatase: 65 (ref 25–125)
Bilirubin, Total: 0.4

## 2022-04-11 LAB — BASIC METABOLIC PANEL
BUN: 7 (ref 4–21)
CO2: 25 — AB (ref 13–22)
Chloride: 109 — AB (ref 99–108)
Creatinine: 0.5 (ref 0.5–1.1)
Glucose: 96
Potassium: 3.6 mEq/L (ref 3.5–5.1)
Sodium: 139 (ref 137–147)

## 2022-04-11 LAB — IRON AND TIBC
Iron: 44 ug/dL (ref 28–170)
Saturation Ratios: 12 % (ref 10.4–31.8)
TIBC: 372 ug/dL (ref 250–450)
UIBC: 328 ug/dL

## 2022-04-11 LAB — CBC AND DIFFERENTIAL
HCT: 35 — AB (ref 36–46)
Hemoglobin: 12 (ref 12.0–16.0)
Neutrophils Absolute: 4.08
Platelets: 420 10*3/uL — AB (ref 150–400)
WBC: 7.7

## 2022-04-11 LAB — COMPREHENSIVE METABOLIC PANEL
Albumin: 4.1 (ref 3.5–5.0)
Calcium: 8.9 (ref 8.7–10.7)

## 2022-04-11 LAB — FERRITIN: Ferritin: 18 ng/mL (ref 11–307)

## 2022-04-20 LAB — MISC LABCORP TEST (SEND OUT): Labcorp test code: 489514

## 2022-04-20 LAB — JAK2 (INCLUDING V617F AND EXON 12), MPL,& CALR W/RFL MPN PANEL (NGS)

## 2022-05-11 NOTE — Progress Notes (Deleted)
[facility]  [address] [place],  [place]  [address] [phone]  Clinic Day:  05/11/2022  Referring physician: Trey Sailors, PA   HISTORY OF PRESENT ILLNESS:  The patient is a 36 y.o. female who I was asked to consult upon for persistent thrombocythemia.  Recent labs at her primary care office showed an elevated platelet count of 474.  According to the patient, these labs were done as part of her routine medical exam.  She denies ever having any previous problems with elevated platelets.  She denies having any recent illnesses or infections which could have reactively led to an elevated platelet count.  She also denies having undergone a previous splenectomy.  She also denies having a history of iron deficiency, which can cause a reactive thrombocythemia.  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  She claims her first DVT developed while pregnant in 2005.  She was given Lovenox up until the delivery of her child.  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  In 2008, she apparently had a small pulmonary embolus for which she was briefly placed on a blood thinner.  Due to other medications she was on at that time, an apparent allergic reaction developed which led to her coming off of her blood thinner.  However, she readily admits she was not enamored with being on a blood thinner and having to potentially take one lifelong.  The patient claims she has not had any further blood clots since 2008.  She claims her mother and maternal aunt have also had issues with blood clots.  PAST MEDICAL HISTORY:   Past Medical History:  Diagnosis Date   Anxiety    Depression    Hypertension    Obesity    VTE (venous thromboembolism)     PAST SURGICAL HISTORY:   Past Surgical History:  Procedure Laterality Date   CESAREAN  SECTION     X 2    CURRENT MEDICATIONS:   Current Outpatient Medications  Medication Sig Dispense Refill   aspirin EC 81 MG tablet Take 81 mg by mouth daily. Swallow whole.     atenolol (TENORMIN) 25 MG tablet Take by mouth daily.     buPROPion (WELLBUTRIN) 100 MG tablet Take 100 mg by mouth 2 (two) times daily.     citalopram (CELEXA) 20 MG tablet Take 20 mg by mouth daily.     meloxicam (MOBIC) 15 MG tablet Take 15 mg by mouth daily.     tiZANidine (ZANAFLEX) 4 MG capsule Take 4 mg by mouth 3 (three) times daily.     No current facility-administered medications for this visit.    ALLERGIES:   Allergies  Allergen Reactions   Shellfish Allergy Hives and Swelling   Iodine Hives    betadine    FAMILY HISTORY:   Family History  Problem Relation Age of Onset   Arthritis Mother    COPD Mother    Diabetes Mother    Hypertension Mother    Heart disease Mother    Cerebral aneurysm Mother    Arthritis Maternal Aunt    Ovarian cancer Maternal Aunt    COPD Maternal Aunt    Diabetes Maternal Aunt    Hypertension Maternal Aunt    Arthritis Maternal Grandmother    Diabetes Maternal Grandmother  Hypertension Brother    CVA Brother    Hypertension Brother    Hypertension Half-Brother    Hypertension Half-Brother    Hypertension Sister    Hypertension Sister    Hypertension Sister    Hypertension Sister    Renal Disease Sister    Hypertension Half-Sister    Hypertension Half-Sister    Crohn's disease Half-Sister    Irritable bowel syndrome Half-Sister    Hypertension Half-Sister     SOCIAL HISTORY:  The patient was born and raised in [place], [place].  She currently lives in town.  She is single, with 2 children.  She is currently unemployed; she previously did warehouse work.  She denies a history of tobacco use, but drinks alcohol occasionally.  REVIEW OF SYSTEMS:  Review of Systems  Constitutional:  Negative for fatigue and fever.  HENT:   Negative for hearing  loss and sore throat.   Eyes:  Positive for eye problems.  Respiratory:  Positive for chest tightness. Negative for cough and hemoptysis.   Cardiovascular:  Negative for chest pain and palpitations.  Gastrointestinal:  Negative for abdominal distention, abdominal pain, blood in stool, constipation, diarrhea, nausea and vomiting.  Endocrine: Negative for hot flashes.  Genitourinary:  Negative for difficulty urinating, dysuria, frequency, hematuria and nocturia.   Musculoskeletal:  Positive for back pain and myalgias. Negative for arthralgias and gait problem.  Skin:  Positive for rash (bilateral cheeks). Negative for itching.  Neurological: Negative.  Negative for dizziness, extremity weakness, gait problem, headaches, light-headedness and numbness.  Hematological: Negative.   Psychiatric/Behavioral:  Positive for depression. Negative for suicidal ideas. The patient is nervous/anxious.      PHYSICAL EXAM:  unknown if currently breastfeeding. Wt Readings from Last 3 Encounters:  04/11/22 282 lb 12.8 oz (128.3 kg)  10/13/20 280 lb (127 kg)  09/20/20 277 lb 6.4 oz (125.8 kg)   There is no height or weight on file to calculate BMI. Performance status (ECOG): 0 - Asymptomatic Physical Exam Constitutional:      Appearance: Normal appearance. She is obese. She is not ill-appearing.  HENT:     Mouth/Throat:     Mouth: Mucous membranes are moist.     Pharynx: Oropharynx is clear. No oropharyngeal exudate or posterior oropharyngeal erythema.  Cardiovascular:     Rate and Rhythm: Normal rate and regular rhythm.     Heart sounds: No murmur heard.    No friction rub. No gallop.  Pulmonary:     Effort: Pulmonary effort is normal. No respiratory distress.     Breath sounds: Normal breath sounds. No wheezing, rhonchi or rales.  Abdominal:     General: Bowel sounds are normal. There is no distension.     Palpations: Abdomen is soft. There is no mass.     Tenderness: There is no abdominal  tenderness.  Musculoskeletal:        General: No swelling.     Right lower leg: No edema.     Left lower leg: No edema.  Lymphadenopathy:     Cervical: No cervical adenopathy.     Upper Body:     Right upper body: No supraclavicular or axillary adenopathy.     Left upper body: No supraclavicular or axillary adenopathy.     Lower Body: No right inguinal adenopathy. No left inguinal adenopathy.  Skin:    General: Skin is warm.     Coloration: Skin is not jaundiced.     Findings: No lesion or rash.  Neurological:  General: No focal deficit present.     Mental Status: She is alert and oriented to person, place, and time. Mental status is at baseline.  Psychiatric:        Mood and Affect: Mood normal.        Behavior: Behavior normal.        Thought Content: Thought content normal.    LABS:     Latest Reference Range & Units 04/11/22 14:29  Iron 28 - 170 ug/dL 44  UIBC ug/dL 328  TIBC 250 - 450 ug/dL 372  Saturation Ratios 10.4 - 31.8 % 12  Ferritin 11 - 307 ng/mL 18        Latest Ref Rng & Units 04/11/2022   12:00 AM 05/13/2021    3:10 PM 09/20/2020   11:11 PM  CBC  WBC  7.7     10.1  9.8   Hemoglobin 12.0 - 16.0 12.0     13.1  10.8   Hematocrit 36 - 46 35     39.4  32.0   Platelets 150 - 400 K/uL 420     484  384      This result is from an external source.       Latest Ref Rng & Units 04/11/2022   12:00 AM 05/13/2021    3:10 PM 09/20/2020   11:11 PM  CMP  Glucose 70 - 99 mg/dL  105  87   BUN 4 - '21 7     14  '$ <5   Creatinine 0.5 - 1.1 0.5     0.85  0.51   Sodium 137 - 147 139     138  136   Potassium 3.5 - 5.1 mEq/L 3.6     3.5  4.0   Chloride 99 - 108 109     105  107   CO2 13 - '22 25     28  23   '$ Calcium 8.7 - 10.7 8.9     8.6  9.3   Total Protein 6.5 - 8.1 g/dL   6.2   Total Bilirubin 0.3 - 1.2 mg/dL   0.4   Alkaline Phos 25 - 125 65      58   AST 13 - 35 18      16   ALT 7 - 35 U/L 17      17      This result is from an external source.     ASSESSMENT & PLAN:  A 36 y.o. female who I was asked to consult upon for thrombocythemia.  Based upon her recent history, there is nothing to suggest her elevated platelet count is a reactive process from an underlying illness or infection. As her hemoglobin is borderline low, I will check her iron parameters today to ensure her elevated platelet count is not a reactive response to iron deficiency.  I will also have JAK2 mutation testing done to ensure her elevated platelet count is not due to to underlying essential thrombocythemia.  Overall, I am pleased that her platelet count has fallen from previously higher levels.  I do not get the sense an ominous hematologic process is present.  As she is clinically doing well, she will be followed conservatively for now.  I will see her back in 1 month to go over all of her labs collected today, as well as to reassess her thrombocythemia at that time.  The patient understands all the plans discussed today and is in agreement with them.  I do appreciate Trey Sailors, PA for his new consult.   Latricia Cerrito Macarthur Critchley, MD

## 2022-05-12 ENCOUNTER — Ambulatory Visit: Payer: Medicaid Other | Admitting: Oncology

## 2022-05-12 ENCOUNTER — Inpatient Hospital Stay: Payer: Medicaid Other | Attending: Oncology

## 2022-05-12 IMAGING — CT CT ANGIO CHEST
2 of 6 series · 18 of 36 positions shown · IV contrast (omnipaque)
Comparison: 11/12/2017

CLINICAL DATA: Chest pain and shortness of breath for 3 days.

EXAM:
CT ANGIOGRAPHY CHEST WITH CONTRAST
TECHNIQUE: Multidetector CT imaging of the chest was performed using the
standard protocol during bolus administration of intravenous
contrast. Multiplanar CT image reconstructions and MIPs were
obtained to evaluate the vascular anatomy.
CONTRAST:  80mL OMNIPAQUE IOHEXOL 350 MG/ML SOLN

[Series 5: thins · axial · 0.72mm/px · z∈[+1346,+1571]mm · 17 of 255 slices shown]
[im 15/255  lung]
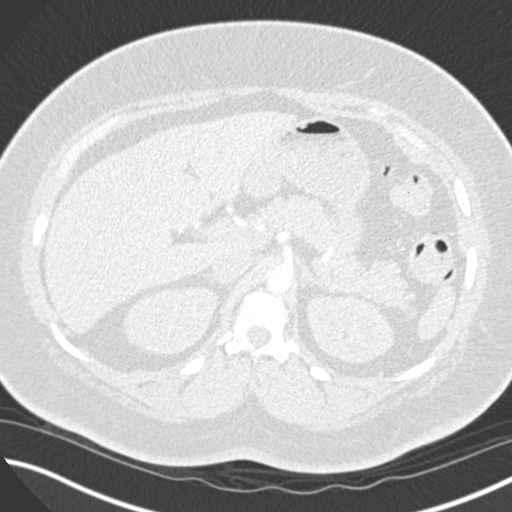
[im 29/255  mediastinal]
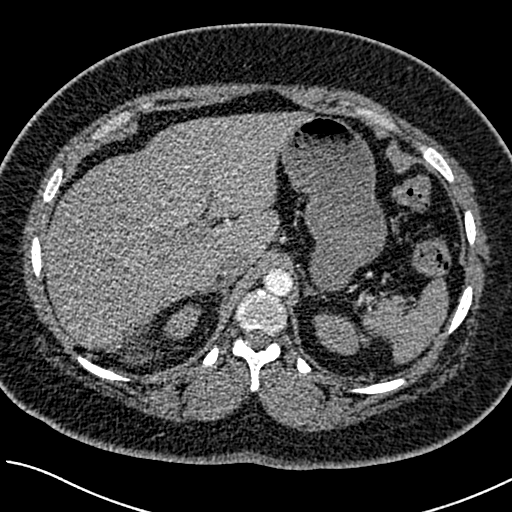
[im 43/255  lung]
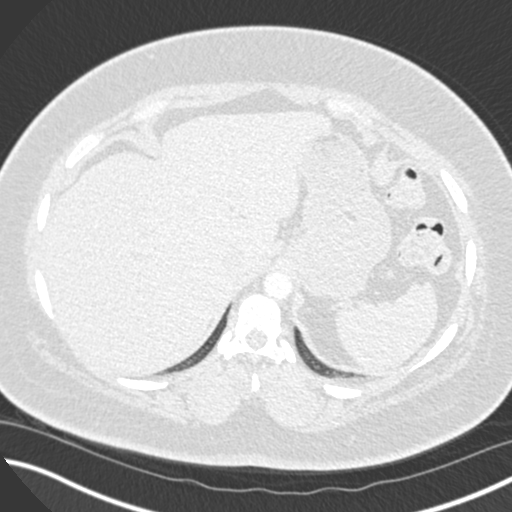
[im 57/255  mediastinal]
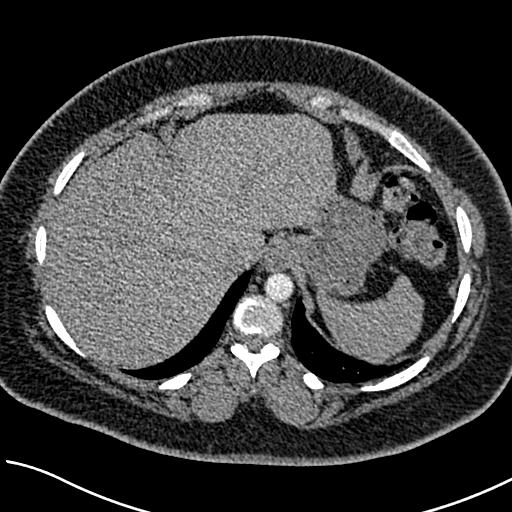
[im 71/255  lung]
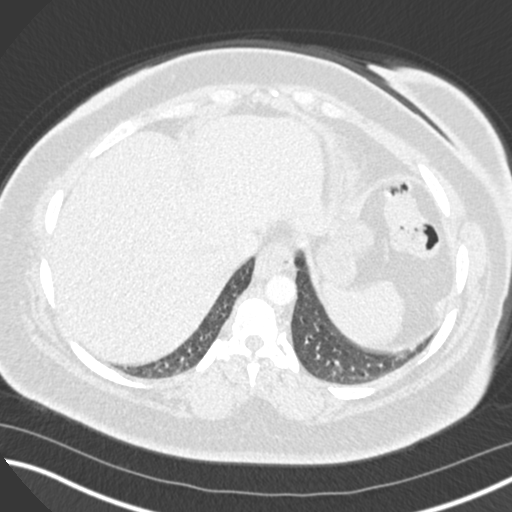
[im 85/255  mediastinal]
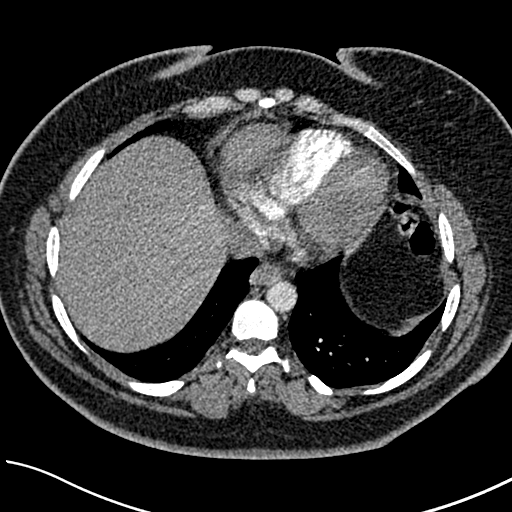
[im 99/255  lung]
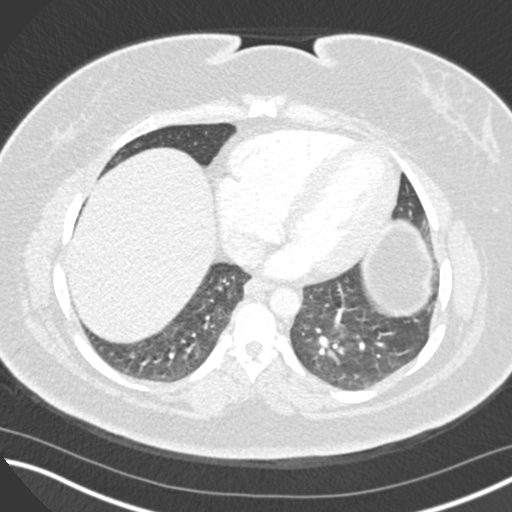
[im 113/255  mediastinal]
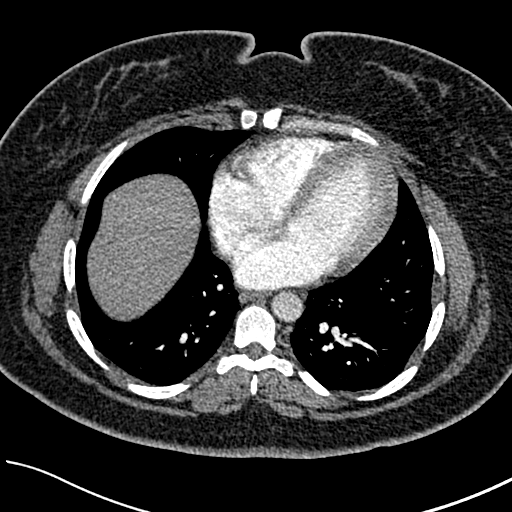
[im 128/255  lung]
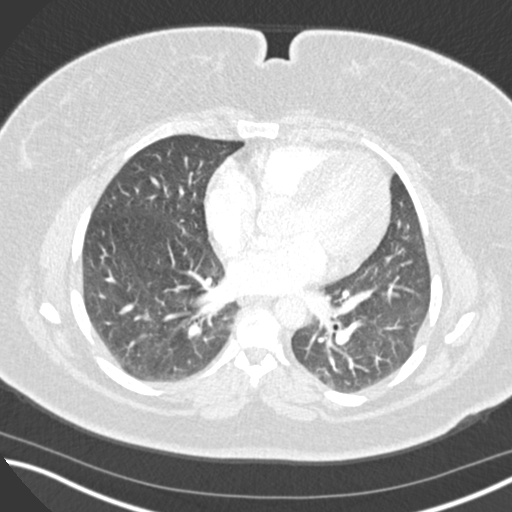
[im 142/255  mediastinal]
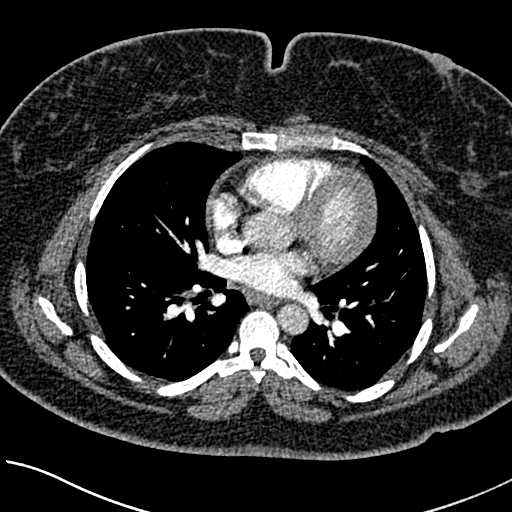
[im 156/255  lung]
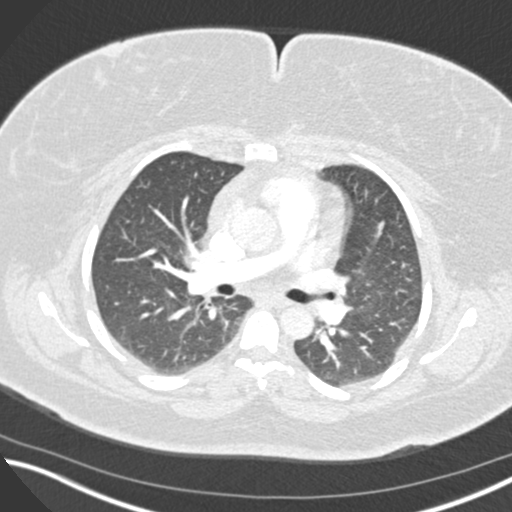
[im 170/255  mediastinal]
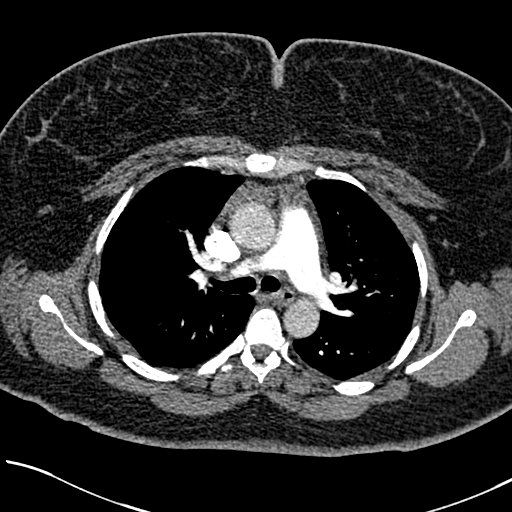
[im 184/255  lung]
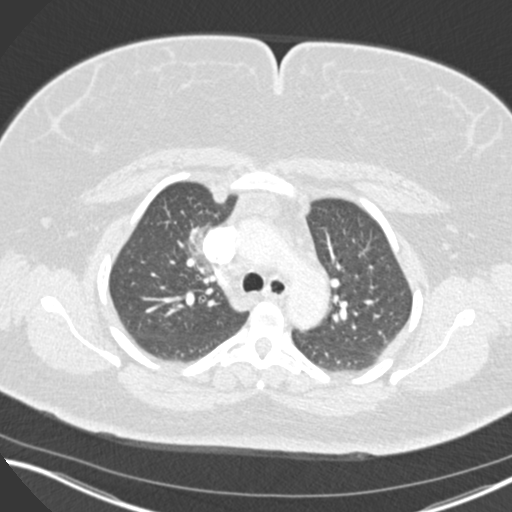
[im 198/255  mediastinal]
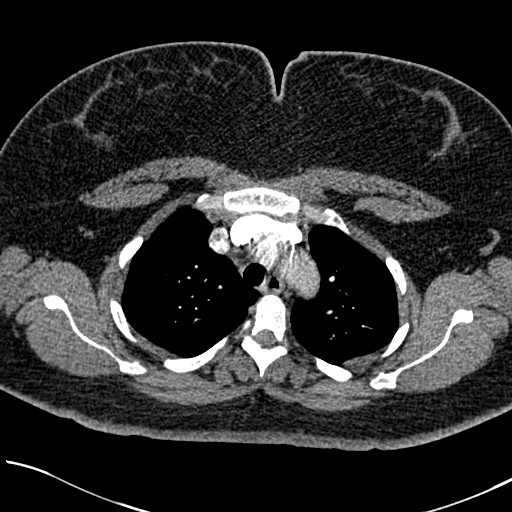
[im 212/255  lung]
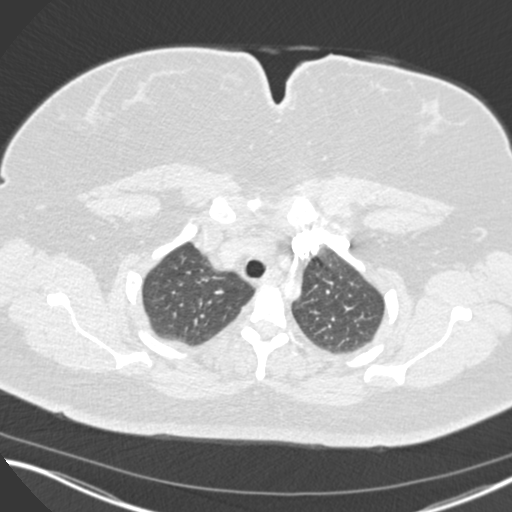
[im 226/255  mediastinal]
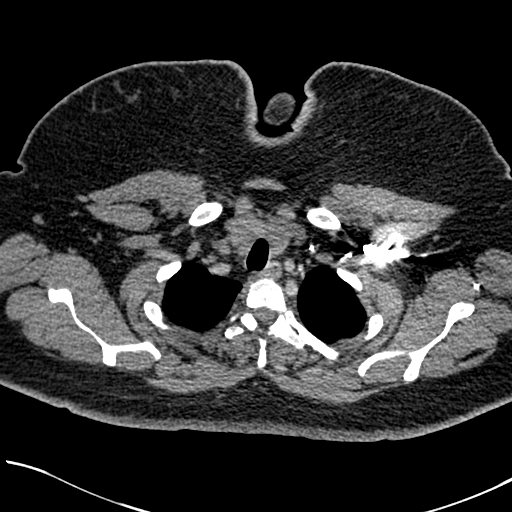
[im 240/255  lung]
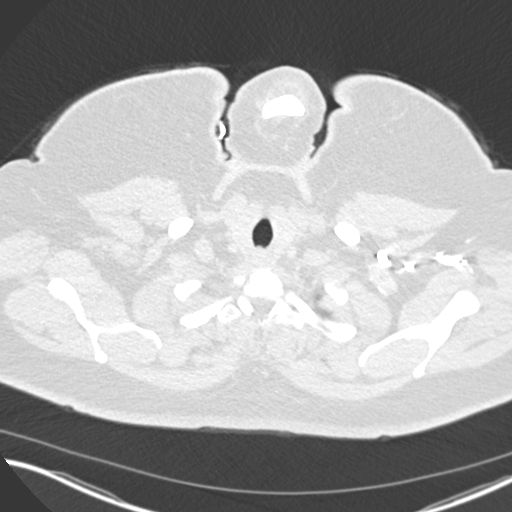

[Series 7: coronal mpr · coronal · 0.53mm/px · 1 of 151 slices shown]
[im 76/151  mediastinal]
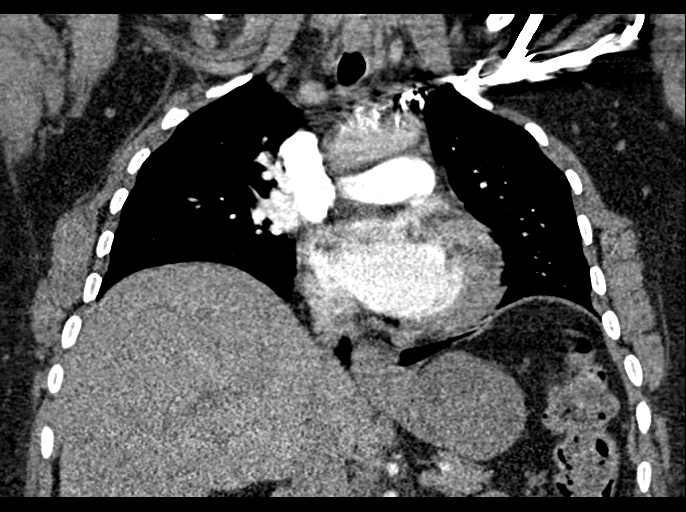

[18 of 36 positions shown; findings below may reference images not displayed]

FINDINGS: Cardiovascular: Satisfactory opacification of the pulmonary arteries
to the segmental level. No evidence of pulmonary embolism. Normal
heart size. No pericardial effusion.

Mediastinum/Nodes: No enlarged mediastinal, hilar, or axillary lymph
nodes. Thyroid gland, trachea, and esophagus demonstrate no
significant findings.

Lungs/Pleura: No pleural effusion, airspace consolidation,
atelectasis or pneumothorax. Calcified granuloma noted in the right
middle lobe.

Upper Abdomen: No acute abnormality.

Musculoskeletal: No chest wall abnormality. No acute or significant
osseous findings.

Review of the MIP images confirms the above findings.
IMPRESSION: Negative exam. No evidence for acute pulmonary embolus.

## 2022-08-15 ENCOUNTER — Other Ambulatory Visit (HOSPITAL_BASED_OUTPATIENT_CLINIC_OR_DEPARTMENT_OTHER): Payer: Self-pay | Admitting: Obstetrics and Gynecology

## 2022-08-15 ENCOUNTER — Encounter (HOSPITAL_BASED_OUTPATIENT_CLINIC_OR_DEPARTMENT_OTHER): Payer: Self-pay | Admitting: Obstetrics and Gynecology

## 2022-08-15 DIAGNOSIS — N939 Abnormal uterine and vaginal bleeding, unspecified: Secondary | ICD-10-CM

## 2022-08-22 ENCOUNTER — Ambulatory Visit (HOSPITAL_BASED_OUTPATIENT_CLINIC_OR_DEPARTMENT_OTHER): Payer: Medicaid Other

## 2023-02-02 ENCOUNTER — Institutional Professional Consult (permissible substitution): Payer: Medicaid Other | Admitting: Adult Health

## 2023-03-07 ENCOUNTER — Institutional Professional Consult (permissible substitution): Payer: Medicaid Other | Admitting: Adult Health

## 2023-04-20 ENCOUNTER — Ambulatory Visit: Payer: Medicaid Other | Admitting: Adult Health

## 2023-04-20 ENCOUNTER — Encounter: Payer: Self-pay | Admitting: Adult Health

## 2023-04-20 VITALS — BP 138/82 | HR 66 | Temp 98.1°F | Ht 63.5 in | Wt 288.6 lb

## 2023-04-20 DIAGNOSIS — R0683 Snoring: Secondary | ICD-10-CM | POA: Diagnosis not present

## 2023-04-20 DIAGNOSIS — G4700 Insomnia, unspecified: Secondary | ICD-10-CM | POA: Diagnosis not present

## 2023-04-20 NOTE — Assessment & Plan Note (Signed)
Healthy sleep regimen

## 2023-04-20 NOTE — Assessment & Plan Note (Signed)
Snoring , restless sleep , daytime sleepiness, insomnia  - discussed how weight can impact sleep and risk for sleep disordered breathing - discussed options to assist with weight loss: combination of diet modification, cardiovascular and strength training exercises   - had an extensive discussion regarding the adverse health consequences related to untreated sleep disordered breathing - specifically discussed the risks for hypertension, coronary artery disease, cardiac dysrhythmias, cerebrovascular disease, and diabetes - lifestyle modification discussed   - discussed how sleep disruption can increase risk of accidents, particularly when driving - safe driving practices were discussed   Plan  Patient Instructions  Set up for home sleep study Healthy sleep regimen Work on healthy weight loss Do not drive if sleepy Follow up in 6 weeks to discuss sleep study results in detail and treatment plan-May be in person or virtual.

## 2023-04-20 NOTE — Progress Notes (Signed)
@Patient  ID: Candice Holder, female    DOB: 1986/04/01, 37 y.o.   MRN: [id number]  Chief Complaint  Patient presents with   Follow-up    Referring provider: Norm Salt, [place]  HPI: 37 year old female seen for sleep consult April 20, 2023 for snoring, restless sleep and daytime sleepiness  TEST/EVENTS :   04/20/2023 Sleep consult  Patient presents for sleep consult today.  Kindly referred by primary care provider Norva Riffle, PA.  Patient complains of snoring, restless sleep and daytime sleepiness.  Patient is also has hypertension and BMI is at 50.  Patient typically goes to sleep about 3 AM.  Takes about 30 minutes to get to sleep.  Gets up several times throughout the night.  Gets up at 9:30 AM.  Weight is up 40 pounds over the last 2 years.  Current weight is at 288 pounds with a BMI at 50.  Patient has never had a sleep study before.  Patient does not nap.  Has no history of congestive heart failure or stroke.  Caffeine intake is minimal.  Epworth score is 19 out of 24.  Typically gets sleepy if she sits down to rest, watch TV, passenger for car, afternoon and eating lunch.  No symptoms suspicious for cataplexy or sleep paralysis.  Does not take any sleep aids. Has upper partial dental plate. Has trouble going to sleep most night, takes a long time to go to sleep. Sedentary, does not exercise.on regular base. Working on weight loss. Has Autistic child, under some stree.   Social history patient is a never smoker.  Social alcohol.  No drug use.  Patient is single.  Has children.  Family history positive for asthma, allergies, heart disease, cancer Medical history significant for hypertension, depression, chronic allergies, OSA   Past Surgical History:  Procedure Laterality Date   CESAREAN SECTION     X 2       Allergies  Allergen Reactions   Shellfish Allergy Hives and Swelling   Iodine Hives    betadine    Immunization History  Administered Date(s) Administered    Tdap 10/06/2019    Past Medical History:  Diagnosis Date   Anxiety    Depression    Hypertension    Obesity    VTE (venous thromboembolism)     Tobacco History: Social History   Tobacco Use  Smoking Status Never  Smokeless Tobacco Never   Counseling given: Not Answered   Outpatient Medications Prior to Visit  Medication Sig Dispense Refill   aspirin EC 81 MG tablet Take 81 mg by mouth daily. Swallow whole.     atenolol (TENORMIN) 25 MG tablet Take by mouth daily.     buPROPion (WELLBUTRIN) 100 MG tablet Take 100 mg by mouth 2 (two) times daily.     citalopram (CELEXA) 20 MG tablet Take 20 mg by mouth daily.     meloxicam (MOBIC) 15 MG tablet Take 15 mg by mouth daily.     tiZANidine (ZANAFLEX) 4 MG capsule Take 4 mg by mouth 3 (three) times daily.     No facility-administered medications prior to visit.     Review of Systems:   Constitutional:   No  weight loss, night sweats,  Fevers, chills, +fatigue, or  lassitude.  HEENT:   No headaches,  Difficulty swallowing,  Tooth/dental problems, or  Sore throat,                No sneezing, itching, ear ache, nasal congestion, post nasal  drip,   CV:  No chest pain,  Orthopnea, PND, swelling in lower extremities, anasarca, dizziness, palpitations, syncope.   GI  No heartburn, indigestion, abdominal pain, nausea, vomiting, diarrhea, change in bowel habits, loss of appetite, bloody stools.   Resp: No shortness of breath with exertion or at rest.  No excess mucus, no productive cough,  No non-productive cough,  No coughing up of blood.  No change in color of mucus.  No wheezing.  No chest wall deformity  Skin: no rash or lesions.  GU: no dysuria, change in color of urine, no urgency or frequency.  No flank pain, no hematuria   MS:  No joint pain or swelling.  No decreased range of motion.  No back pain.    Physical Exam  BP 138/82 (BP Location: Left Arm, Patient Position: Sitting, Cuff Size: Large)   Pulse 66   Temp  98.1 F (36.7 C) (Oral)   Ht 5' 3.5" (1.613 m)   Wt 288 lb 9.6 oz (130.9 kg)   LMP 02/17/2023 (Exact Date)   SpO2 97%   BMI 50.32 kg/m   GEN: A/Ox3; pleasant , NAD, well nourished    HEENT:  /AT,  NOSE-clear, THROAT-clear, no lesions, no postnasal drip or exudate noted.  Class 2-3 MP airway   NECK:  Supple w/ fair ROM; no JVD; normal carotid impulses w/o bruits; no thyromegaly or nodules palpated; no lymphadenopathy.    RESP  Clear  P & A; w/o, wheezes/ rales/ or rhonchi. no accessory muscle use, no dullness to percussion  CARD:  RRR, no m/r/g, no peripheral edema, pulses intact, no cyanosis or clubbing.  GI:   Soft & nt; nml bowel sounds; no organomegaly or masses detected.   Musco: Warm bil, no deformities or joint swelling noted.   Neuro: alert, no focal deficits noted.    Skin: Warm, no lesions or rashes    Lab Results:  CBC    BNP No results found for: "BNP"  ProBNP No results found for: "PROBNP"  Imaging: No results found.  Administration History     None           No data to display          No results found for: "NITRICOXIDE"      Assessment & Plan:   Insomnia Healthy sleep regimen   Snoring Snoring , restless sleep , daytime sleepiness, insomnia  - discussed how weight can impact sleep and risk for sleep disordered breathing - discussed options to assist with weight loss: combination of diet modification, cardiovascular and strength training exercises   - had an extensive discussion regarding the adverse health consequences related to untreated sleep disordered breathing - specifically discussed the risks for hypertension, coronary artery disease, cardiac dysrhythmias, cerebrovascular disease, and diabetes - lifestyle modification discussed   - discussed how sleep disruption can increase risk of accidents, particularly when driving - safe driving practices were discussed   Plan  Patient Instructions  Set up for home sleep  study Healthy sleep regimen Work on healthy weight loss Do not drive if sleepy Follow up in 6 weeks to discuss sleep study results in detail and treatment plan-May be in person or virtual.      Rubye Oaks, NP 04/20/2023

## 2023-04-20 NOTE — Patient Instructions (Signed)
Set up for home sleep study Healthy sleep regimen Work on healthy weight loss Do not drive if sleepy Follow up in 6 weeks to discuss sleep study results in detail and treatment plan-May be in person or virtual.

## 2023-04-26 ENCOUNTER — Telehealth: Payer: Medicaid Other | Admitting: Adult Health

## 2023-04-26 NOTE — Telephone Encounter (Signed)
[facility] does not take patient's insurance and she would have to pay $270. Can we do HST through our office? Please advise and call patient back.

## 2023-05-01 ENCOUNTER — Ambulatory Visit (INDEPENDENT_AMBULATORY_CARE_PROVIDER_SITE_OTHER): Payer: Medicaid Other | Admitting: Adult Health

## 2023-05-01 ENCOUNTER — Encounter (HOSPITAL_COMMUNITY): Payer: Self-pay | Admitting: Emergency Medicine

## 2023-05-01 ENCOUNTER — Emergency Department (HOSPITAL_COMMUNITY): Payer: Medicaid Other

## 2023-05-01 ENCOUNTER — Other Ambulatory Visit: Payer: Self-pay

## 2023-05-01 ENCOUNTER — Telehealth: Payer: Self-pay | Admitting: Adult Health

## 2023-05-01 ENCOUNTER — Emergency Department (HOSPITAL_COMMUNITY)
Admission: EM | Admit: 2023-05-01 | Discharge: 2023-05-01 | Disposition: A | Discharge status: Home / Self Care | Payer: Medicaid Other | Attending: Emergency Medicine | Admitting: Emergency Medicine

## 2023-05-01 ENCOUNTER — Ambulatory Visit: Payer: Medicaid Other

## 2023-05-01 VITALS — BP 165/83 | HR 83

## 2023-05-01 DIAGNOSIS — I10 Essential (primary) hypertension: Secondary | ICD-10-CM | POA: Insufficient documentation

## 2023-05-01 DIAGNOSIS — Z79899 Other long term (current) drug therapy: Secondary | ICD-10-CM | POA: Insufficient documentation

## 2023-05-01 DIAGNOSIS — R519 Headache, unspecified: Secondary | ICD-10-CM | POA: Diagnosis not present

## 2023-05-01 DIAGNOSIS — R079 Chest pain, unspecified: Secondary | ICD-10-CM | POA: Diagnosis present

## 2023-05-01 DIAGNOSIS — Z7982 Long term (current) use of aspirin: Secondary | ICD-10-CM | POA: Insufficient documentation

## 2023-05-01 DIAGNOSIS — R0683 Snoring: Secondary | ICD-10-CM

## 2023-05-01 DIAGNOSIS — R0789 Other chest pain: Secondary | ICD-10-CM | POA: Diagnosis not present

## 2023-05-01 DIAGNOSIS — R110 Nausea: Secondary | ICD-10-CM | POA: Diagnosis not present

## 2023-05-01 LAB — CBC
HCT: 37.4 % (ref 36.0–46.0)
Hemoglobin: 12.8 g/dL (ref 12.0–15.0)
MCH: 30.3 pg (ref 26.0–34.0)
MCHC: 34.2 g/dL (ref 30.0–36.0)
MCV: 88.6 fL (ref 80.0–100.0)
Platelets: 445 10*3/uL — ABNORMAL HIGH (ref 150–400)
RBC: 4.22 MIL/uL (ref 3.87–5.11)
RDW: 14.9 % (ref 11.5–15.5)
WBC: 8.8 10*3/uL (ref 4.0–10.5)
nRBC: 0 % (ref 0.0–0.2)

## 2023-05-01 LAB — BASIC METABOLIC PANEL
Anion gap: 8 (ref 5–15)
BUN: 11 mg/dL (ref 6–20)
CO2: 22 mmol/L (ref 22–32)
Calcium: 9.2 mg/dL (ref 8.9–10.3)
Chloride: 107 mmol/L (ref 98–111)
Creatinine, Ser: 0.58 mg/dL (ref 0.44–1.00)
GFR, Estimated: >60 mL/min (ref >60)
Glucose, Bld: 116 mg/dL — ABNORMAL HIGH (ref 70–99)
Potassium: 3.4 mmol/L — ABNORMAL LOW (ref 3.5–5.1)
Sodium: 137 mmol/L (ref 135–145)

## 2023-05-01 LAB — TROPONIN I (HIGH SENSITIVITY)
Troponin I (High Sensitivity): 9 ng/L (ref <18)
Troponin I (High Sensitivity): 7 ng/L (ref <18)

## 2023-05-01 LAB — HCG, SERUM, QUALITATIVE: Preg, Serum: NEGATIVE

## 2023-05-01 MED ORDER — KETOROLAC TROMETHAMINE 30 MG/ML IJ SOLN
30.0000 mg | Freq: Once | INTRAMUSCULAR | Status: AC
  Administered 2023-05-01: 30 mg via INTRAVENOUS
  Filled 2023-05-01: qty 1

## 2023-05-01 MED ORDER — IOHEXOL 350 MG/ML SOLN
80.0000 mL | Freq: Once | INTRAVENOUS | Status: AC | PRN
  Administered 2023-05-01: 80 mL via INTRAVENOUS

## 2023-05-01 NOTE — ED Provider Notes (Signed)
[place] EMERGENCY DEPARTMENT AT [facility] Provider Note   CSN: [id number] Arrival date & time: 05/01/23  1557     History  Chief Complaint  Patient presents with   Chest Pain   Headache   Nausea    Candice Holder is a 37 y.o. female.  37 year old female presenting with intermittent headaches, chest pain/pressure which has been present for 4 days.  She also endorses new onset shortness of breath since last night and since then the chest pressure has become more intense and persistent.  She states her entire left side has been tingling.  She has history of blood clots in both legs during pregnancy.  There is family history of heart disease however no history of spontaneous cardiac death.   Chest Pain Associated symptoms: headache   Headache  Chart review reveals oncology note in 2023 to further shed light on her personal history of 2 DVTs and a PE.  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    Home Medications Prior to Admission medications   Medication Sig Start Date End Date Taking? Authorizing Provider  aspirin EC 81 MG tablet Take 81 mg by mouth daily. Swallow whole.    [provider]  atenolol (TENORMIN) 25 MG tablet Take by mouth daily.    [provider]  buPROPion (WELLBUTRIN) 100 MG tablet Take 100 mg by mouth 2 (two) times daily.    [provider]  citalopram (CELEXA) 20 MG tablet Take 20 mg by mouth daily.    [provider]  meloxicam (MOBIC) 15 MG tablet Take 15 mg by mouth daily.    [provider]  tiZANidine (ZANAFLEX) 4 MG capsule Take 4 mg by mouth 3 (three) times daily.    [provider]      Allergies    Shellfish allergy and Iodine    Review of Systems   Review of Systems  Cardiovascular:  Positive for  chest pain.  Neurological:  Positive for headaches.   Physical Exam Updated Vital Signs BP (!) 154/99   Pulse 91   Temp 98.6 F (37 C)   Resp 15   LMP 02/17/2023 (Exact Date)   SpO2 97%  Physical Exam Constitutional:      General: She is not in acute distress.    Appearance: She is not ill-appearing.  Eyes:     Extraocular Movements: Extraocular movements intact.     Pupils: Pupils are equal, round, and reactive to light.  Cardiovascular:     Rate and Rhythm: Normal rate and regular rhythm.     Heart sounds: Normal heart sounds.  Pulmonary:     Effort: Pulmonary effort is normal. No tachypnea or respiratory distress.     Breath sounds: Normal breath sounds.  Abdominal:     General: Bowel sounds are normal.     Palpations: Abdomen is soft.  Neurological:     General: No focal deficit present.     Cranial Nerves: No cranial nerve deficit.     Motor: No weakness.     ED Results / Procedures / Treatments   Labs (all labs ordered are listed, but only abnormal results are displayed) Labs Reviewed  BASIC METABOLIC PANEL - Abnormal; Notable for the following components:      Result Value   Potassium  3.4 (*)    Glucose, Bld 116 (*)    All other components within normal limits  CBC - Abnormal; Notable for the following components:   Platelets 445 (*)    All other components within normal limits  HCG, SERUM, QUALITATIVE  TROPONIN I (HIGH SENSITIVITY)  TROPONIN I (HIGH SENSITIVITY)    EKG EKG Interpretation Date/Time:  Monday May 01 2023 16:15:50 EST Ventricular Rate:  64 PR Interval:  131 QRS Duration:  98 QT Interval:  483 QTC Calculation: 499 R Axis:   1  Text Interpretation: Sinus rhythm Probable anterior infarct, age indeterminate Abnormal T, consider ischemia, diffuse leads Lateral leads are also involved No significant change since last tracing Confirmed by Jacalyn Lefevre [id number]) on 05/01/2023 7:10:41 PM  Radiology CT Angio Chest PE W and/or Wo  Contrast  Result Date: 05/01/2023 CLINICAL DATA:  Chest pain EXAM: CT ANGIOGRAPHY CHEST WITH CONTRAST TECHNIQUE: Multidetector CT imaging of the chest was performed using the standard protocol during bolus administration of intravenous contrast. Multiplanar CT image reconstructions and MIPs were obtained to evaluate the vascular anatomy. RADIATION DOSE REDUCTION: This exam was performed according to the departmental dose-optimization program which includes automated exposure control, adjustment of the mA and/or kV according to patient size and/or use of iterative reconstruction technique. CONTRAST:  80mL OMNIPAQUE IOHEXOL 350 MG/ML SOLN COMPARISON:  Chest x-ray from earlier in the same day FINDINGS: Cardiovascular: Thoracic aorta is well visualize without evidence of aneurysmal dilatation or dissection. No cardiac enlargement is noted. The pulmonary artery shows a normal branching pattern bilaterally. No filling defect to suggest pulmonary embolism is seen. No significant coronary calcifications are noted. Mediastinum/Nodes: Thoracic inlet is within normal limits. No hilar or mediastinal adenopathy is noted. The esophagus as visualized is within normal limits. Lungs/Pleura: The lungs are well aerated bilaterally. No focal infiltrate or effusion is seen. No parenchymal nodules are noted. Upper Abdomen: Visualized upper abdomen shows multiple gallstones without complicating factors. Musculoskeletal: No chest wall abnormality. No acute or significant osseous findings. Review of the MIP images confirms the above findings. IMPRESSION: No evidence of pulmonary emboli. Cholelithiasis without complicating factors. No other focal abnormality is noted. Electronically Signed   By: Alcide Clever M.D.   On: 05/01/2023 22:49   DG Chest 2 View  Result Date: 05/01/2023 CLINICAL DATA:  Chest pain EXAM: CHEST - 2 VIEW COMPARISON:  05/13/2021 FINDINGS: Frontal and lateral views of the chest demonstrate an unremarkable cardiac  silhouette. No airspace disease, effusion, or pneumothorax. No acute bony abnormalities. IMPRESSION: 1. No acute intrathoracic process. Electronically Signed   By: Sharlet Salina M.D.   On: 05/01/2023 19:51    Procedures Procedures    Medications Ordered in ED Medications  ketorolac (TORADOL) 30 MG/ML injection 30 mg (30 mg Intravenous Given 05/01/23 2010)  iohexol (OMNIPAQUE) 350 MG/ML injection 80 mL (80 mLs Intravenous Contrast Given 05/01/23 2024)    ED Course/ Medical Decision Making/ A&P                                 Medical Decision Making 37 year old female presenting with worsening of chest pressure and shortness of breath x 1 day and pertinent past medical history of 2 DVTs and one PE currently not on blood thinners.  Her vague accompanying symptomatology may represent migraine, headache however given her history, greatest concern is PE.  Troponin, CBC, BMP unremarkable with the exception of thrombocythemia however this is known in  her medical history.  Urine pregnancy negative.  Chest x-ray normal.  CTA PE negative for PE.  Patient mains stable although hypertensive up to 150s.90s throughout encounter.  Reassurance provided.  Recommend follow-up with cardiology in the outpatient setting and placed referral prior to discharge.  Amount and/or Complexity of Data Reviewed Labs: ordered. Radiology: ordered.        Final Clinical Impression(s) / ED Diagnoses Final diagnoses:  Primary hypertension  Atypical chest pain   Rx / DC Orders ED Discharge Orders          Ordered    Ambulatory referral to Cardiology        05/01/23 2259             Shelby Mattocks, DO 05/01/23 2300    Jacalyn Lefevre, MD 05/09/23 [phone]

## 2023-05-01 NOTE — Telephone Encounter (Signed)
Came into office today to pick up sleep study with scheduling team. Complained of chest pain, dizziness, nausea, headache , pain in arm and leg. Going on for 5 days but worse today .  Alert and no confusion .  O2 sats 98%. Blood pressure 165/93.  Placed on schedule

## 2023-05-01 NOTE — ED Triage Notes (Addendum)
Patient was going to her pulmonologist. She told him she was having chest pain and headaches along with stress for the past few days. She usually experiences these symptoms when she tries to go to bed. Her MD told her to come here.   EMS vitals: 130/70 BP 60 HR 98% SPO2 on room air   Patient states she is having pain on the whole left side of her body with pressure like chest pain. She reports nausea, lightheadedness and "off balance."

## 2023-05-01 NOTE — Discharge Instructions (Signed)
Your blood work was reassuring and showed no signs of heart attack.  Additionally you were scanned for any potential pneumonia or pulmonary embolism in your imaging was negative.  I do recommend you follow-up with cardiology and I have placed a referral for this.

## 2023-05-01 NOTE — Progress Notes (Signed)
@Patient  ID: Candice Holder, female    DOB: [birth year], 37 y.o.   MRN: [id number]  Chief Complaint  Patient presents with   Acute Visit    Referring provider: Norm Salt, PA  HPI: 37 yo female never smoker seen for sleep consult 04/20/23 Medical history for Hypertension   TEST/EVENTS :   05/01/2023 Acute OV: Chest pain  Patient came into today to pick up home sleep study device. She was seen for sleep consult earlier this month. On arrival to office today she complained of chest pain, dizziness, heart racing, dizziness and headache. Symptoms have been present for 5 days but are getting worse. Now has left sided pain down arm and legs. Feels weird all over. Has hypertension . Blood pressure is elevated 165/83. O2 sats 98%. She was placed on Oxygen 2l/m . Ongoing chest pain and pressure , heaviness and pressure in chest . EMS was called for emergency transport to ER .  EKG with lateral T wave changes.     Allergies  Allergen Reactions   Shellfish Allergy Hives and Swelling   Iodine Hives    betadine    Immunization History  Administered Date(s) Administered   Tdap 10/06/2019    Past Medical History:  Diagnosis Date   Anxiety    Depression    Hypertension    Obesity    VTE (venous thromboembolism)     Tobacco History: Social History   Tobacco Use  Smoking Status Never  Smokeless Tobacco Never   Counseling given: Not Answered   Outpatient Medications Prior to Visit  Medication Sig Dispense Refill   aspirin EC 81 MG tablet Take 81 mg by mouth daily. Swallow whole.     atenolol (TENORMIN) 25 MG tablet Take by mouth daily.     buPROPion (WELLBUTRIN) 100 MG tablet Take 100 mg by mouth 2 (two) times daily.     citalopram (CELEXA) 20 MG tablet Take 20 mg by mouth daily.     meloxicam (MOBIC) 15 MG tablet Take 15 mg by mouth daily.     tiZANidine (ZANAFLEX) 4 MG capsule Take 4 mg by mouth 3 (three) times daily.     No facility-administered medications prior  to visit.     Review of Systems:   Constitutional:   No  weight loss, night sweats,  Fevers, chills, +fatigue, or  lassitude.  HEENT:   No headaches,  Difficulty swallowing,  Tooth/dental problems, or  Sore throat,                No sneezing, itching, ear ache, nasal congestion, post nasal drip,   CV:  No chest pain,  Orthopnea, PND, swelling in lower extremities, anasarca, dizziness, palpitations, syncope.   GI  No heartburn, indigestion, abdominal pain, nausea, vomiting, diarrhea, change in bowel habits, loss of appetite, bloody stools.   Resp:   No chest wall deformity  Skin: no rash or lesions.  GU: no dysuria, change in color of urine, no urgency or frequency.  No flank pain, no hematuria   MS:  No joint pain or swelling.  No decreased range of motion.  No back pain.    Physical Exam  BP (!) 165/83   Pulse 83   LMP 02/17/2023 (Exact Date)   SpO2 98%   GEN: A/Ox3; pleasant , NAD, well nourished    HEENT:  [place]/AT,  EACs-clear, TMs-wnl, NOSE-clear, THROAT-clear, no lesions, no postnasal drip or exudate noted.  Class 3-4 MP airway   NECK:  Supple w/  fair ROM; no JVD; normal carotid impulses w/o bruits; no thyromegaly or nodules palpated; no lymphadenopathy.    RESP  Clear  P & A; w/o, wheezes/ rales/ or rhonchi. no accessory muscle use, no dullness to percussion  CARD:  RRR, no m/r/g, no peripheral edema, pulses intact, no cyanosis or clubbing.  GI:   Soft & nt; nml bowel sounds; no organomegaly or masses detected.   Musco: Warm bil, no deformities or joint swelling noted.   Neuro: alert, no focal deficits noted.    Skin: Warm, no lesions or rashes    Lab Results:  CBC    Component Value Date/Time   WBC 7.7 04/11/2022 0000   WBC 10.1 05/13/2021 1510   RBC 4.01 04/11/2022 0000   HGB 12.0 04/11/2022 0000   HCT 35 (A) 04/11/2022 0000   PLT 420 (A) 04/11/2022 0000   MCV 91.2 05/13/2021 1510   MCH 30.3 05/13/2021 1510   MCHC 33.2 05/13/2021 1510   RDW  13.9 05/13/2021 1510   LYMPHSABS 2.0 09/20/2020 2311   MONOABS 0.6 09/20/2020 2311   EOSABS 0.0 09/20/2020 2311   BASOSABS 0.0 09/20/2020 2311    BMET    Component Value Date/Time   NA 139 04/11/2022 0000   K 3.6 04/11/2022 0000   CL 109 (A) 04/11/2022 0000   CO2 25 (A) 04/11/2022 0000   GLUCOSE 105 (H) 05/13/2021 1510   BUN 7 04/11/2022 0000   CREATININE 0.5 04/11/2022 0000   CREATININE 0.85 05/13/2021 1510   CALCIUM 8.9 04/11/2022 0000   GFRNONAA >60 05/13/2021 1510   GFRAA >60 10/09/2018 1419    BNP No results found for: "BNP"  ProBNP No results found for: "PROBNP"  Imaging: No results found.  Administration History     None           No data to display          No results found for: "NITRICOXIDE"      Assessment & Plan:   Acute chest pain Acute chest pain - questionable etiology . Worsening pain for last 5 days with associated dizziness, left side pain, racing heart.  EKG with nonspecific T Wave changes.  EMS called and placed on Oxygen 2l/m  Will need transport to ER for further evaluation -rule of acute coronary syndrome.  Report to EMS -patient in stable condition.     Rubye Oaks, NP 05/01/2023

## 2023-05-01 NOTE — Patient Instructions (Signed)
Transport to ER

## 2023-05-01 NOTE — Assessment & Plan Note (Addendum)
Acute chest pain - questionable etiology . Worsening pain for last 5 days with associated dizziness, left side pain, racing heart.  EKG with nonspecific T Wave changes.  EMS called and placed on Oxygen 2l/m  Will need transport to ER for further evaluation -rule of acute coronary syndrome.  Report to EMS -patient in stable condition.

## 2023-06-27 ENCOUNTER — Ambulatory Visit: Payer: Medicaid Other | Admitting: Adult Health

## 2023-06-27 ENCOUNTER — Telehealth: Payer: Medicaid Other | Admitting: *Deleted

## 2023-06-27 NOTE — Telephone Encounter (Signed)
 Called and spoke with patient, she states she is coming in this afternoon at 2 pm to pick up the kit to do the HST through our office.  I told her I would cancel her appointment with Tammy for this afternoon at 2:30 and asked that she schedule a follow up for 2 weeks out after she returns the kit.  She verbalized understanding.  Appointment for today at 2:30 pm cancelled.  Nothing further needed.

## 2023-06-27 NOTE — Telephone Encounter (Signed)
 Messaged Jennifer with [employer], I was advised that patient has Medicaid and they are not able to bill medicaid.  Patient decided to delay testing.  Message sent to [facility] to see if she can be tested with one of our kits though our office.  Will cancel her appointment for this afternoon since her sleep study has not been completed.  PCCs:  Can this patient be set up for a HST through our office since [employer] cannot bill Medicaid or does she need an in lab sleep study?  Please advise, thank you.

## 2023-06-29 ENCOUNTER — Ambulatory Visit: Payer: Medicaid Other

## 2023-07-21 ENCOUNTER — Other Ambulatory Visit: Payer: Medicaid Other

## 2023-07-21 ENCOUNTER — Ambulatory Visit: Payer: Medicaid Other | Admitting: Oncology

## 2023-07-27 ENCOUNTER — Other Ambulatory Visit: Payer: Self-pay | Admitting: Oncology

## 2023-07-27 NOTE — Progress Notes (Signed)
 [facility]  [address] [place],  [place]  [address] [phone]  Clinic Day:  07/28/2023  Referring physician: Norm Salt, PA   HISTORY OF PRESENT ILLNESS:  The patient is a 38 y.o. 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  Recent outside labs showed an elevated platelet count of 482.  According to the patient, these labs were done as part of her routine medical exam.  She has had mildly elevated platelets dating back for the past few years.  Of note, JAK2, calreticulin and MPL mutation testing has all coe back negative.   She denies having any recent illnesses or infections which could have reactively led to an elevated platelet count.  She also denies having undergone a previous splenectomy.  She also denies having a history of iron deficiency, which can cause a reactive thrombocythemia.  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  She claims her first DVT developed while pregnant in 2005.  She was given Lovenox up until the delivery of her child.  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  In 2008, she apparently had a small pulmonary embolus for which she was briefly placed on a blood thinner.  Due to other medications she was on at that time, an apparent allergic reaction developed which led to her coming off of her blood thinner.  The patient has not had any further blood clots since 2008.    PHYSICAL EXAM:  Blood pressure (!) 146/96, pulse 67, temperature 98.7 F (37.1 C), temperature source Oral, resp. rate 14, height 5' 3.5" (1.613 m), weight 284 lb 8 oz (129 kg), SpO2 98%, unknown if currently breastfeeding. Wt Readings from Last 3 Encounters:  07/28/23 284 lb 8 oz (129 kg)  05/01/23 288 lb 9.3 oz (130.9 kg)  04/20/23 288 lb 9.6 oz (130.9 kg)   Body mass index is 49.61 kg/m. Performance status (ECOG): 0 -  Asymptomatic Physical Exam Constitutional:      Appearance: Normal appearance. She is obese. She is not ill-appearing.  HENT:     Mouth/Throat:     Mouth: Mucous membranes are moist.     Pharynx: Oropharynx is clear. No oropharyngeal exudate or posterior oropharyngeal erythema.  Cardiovascular:     Rate and Rhythm: Normal rate and regular rhythm.     Heart sounds: No murmur heard.    No friction rub. No gallop.  Pulmonary:     Effort: Pulmonary effort is normal. No respiratory distress.     Breath sounds: Normal breath sounds. No wheezing, rhonchi or rales.  Abdominal:     General: Bowel sounds are normal. There is no distension.     Palpations: Abdomen is soft. There is no mass.     Tenderness: There is no abdominal tenderness.  Musculoskeletal:        General: No swelling.     Right lower leg: No edema.     Left lower leg: No edema.  Lymphadenopathy:     Cervical: No cervical adenopathy.     Upper Body:     Right upper body: No supraclavicular or axillary adenopathy.     Left upper body: No supraclavicular or axillary adenopathy.     Lower Body: No right inguinal adenopathy. No left inguinal adenopathy.  Skin:    General: Skin is warm.     Coloration: Skin  is not jaundiced.     Findings: No lesion or rash.  Neurological:     General: No focal deficit present.     Mental Status: She is alert and oriented to person, place, and time. Mental status is at baseline.  Psychiatric:        Mood and Affect: Mood normal.        Behavior: Behavior normal.        Thought Content: Thought content normal.    LABS:      Latest Ref Rng & Units 07/28/2023   10:45 AM 05/01/2023    4:28 PM 04/11/2022   12:00 AM  CBC  WBC 4.0 - 10.5 K/uL 8.7  8.8  7.7      Hemoglobin 12.0 - 15.0 g/dL 16.1  09.6  04.5      Hematocrit 36.0 - 46.0 % 36.3  37.4  35      Platelets 150 - 400 K/uL 450  445  420         This result is from an external source.      Latest Ref Rng & Units 05/01/2023     4:28 PM 04/11/2022   12:00 AM 05/13/2021    3:10 PM  CMP  Glucose 70 - 99 mg/dL 409   811   BUN 6 - 20 mg/dL 11  7     14    Creatinine 0.44 - 1.00 mg/dL 9.14  0.5     7.82   Sodium 135 - 145 mmol/L 137  139     138   Potassium 3.5 - 5.1 mmol/L 3.4  3.6     3.5   Chloride 98 - 111 mmol/L 107  109     105   CO2 22 - 32 mmol/L 22  25     28    Calcium 8.9 - 10.3 mg/dL 9.2  8.9     8.6   Alkaline Phos 25 - 125  65       AST 13 - 35  18       ALT 7 - 35 U/L  17          This result is from an external source.    Latest Reference Range & Units 07/28/23 10:45  Iron 28 - 170 ug/dL 69  UIBC ug/dL 956  TIBC 213 - 086 ug/dL 578  Saturation Ratios 10.4 - 31.8 % 19  Ferritin 11 - 307 ng/mL 50    ASSESSMENT & PLAN:  A 38 y.o. female with mild thrombocythemia.  Her mildly elevated platelets today are not much different than what they have been over the past few years.  As mentioned previously, previous testing for myeloproliferative disorders came back negative.  Her labs today show no evidence of iron deficiency being behind her elevated platelets.  As her platelets are stable and only minimally elevated, they will continue to be followed conservatively.  I will see her back in 1 year for repeat clinical assessment. The patient understands all the plans discussed today and is in agreement with them.  Natallia Stellmach Kirby Funk, MD

## 2023-07-28 ENCOUNTER — Other Ambulatory Visit: Payer: Self-pay

## 2023-07-28 ENCOUNTER — Other Ambulatory Visit: Payer: Self-pay | Admitting: Oncology

## 2023-07-28 ENCOUNTER — Inpatient Hospital Stay: Payer: Medicaid Other | Attending: Oncology

## 2023-07-28 ENCOUNTER — Inpatient Hospital Stay (HOSPITAL_BASED_OUTPATIENT_CLINIC_OR_DEPARTMENT_OTHER): Payer: Medicaid Other | Admitting: Oncology

## 2023-07-28 VITALS — BP 146/96 | HR 67 | Temp 98.7°F | Resp 14 | Ht 63.5 in | Wt 284.5 lb

## 2023-07-28 DIAGNOSIS — R7989 Other specified abnormal findings of blood chemistry: Secondary | ICD-10-CM

## 2023-07-28 DIAGNOSIS — Z86718 Personal history of other venous thrombosis and embolism: Secondary | ICD-10-CM | POA: Diagnosis not present

## 2023-07-28 DIAGNOSIS — Z86711 Personal history of pulmonary embolism: Secondary | ICD-10-CM | POA: Insufficient documentation

## 2023-07-28 DIAGNOSIS — D473 Essential (hemorrhagic) thrombocythemia: Secondary | ICD-10-CM

## 2023-07-28 DIAGNOSIS — D75839 Thrombocytosis, unspecified: Secondary | ICD-10-CM | POA: Diagnosis present

## 2023-07-28 LAB — CBC WITH DIFFERENTIAL (CANCER CENTER ONLY)
Abs Immature Granulocytes: 0.02 10*3/uL (ref 0.00–0.07)
Basophils Absolute: 0.1 10*3/uL (ref 0.0–0.1)
Basophils Relative: 1 %
Eosinophils Absolute: 0.1 10*3/uL (ref 0.0–0.5)
Eosinophils Relative: 1 %
HCT: 36.3 % (ref 36.0–46.0)
Hemoglobin: 12.7 g/dL (ref 12.0–15.0)
Immature Granulocytes: 0 %
Lymphocytes Relative: 38 %
Lymphs Abs: 3.3 10*3/uL (ref 0.7–4.0)
MCH: 29.8 pg (ref 26.0–34.0)
MCHC: 35 g/dL (ref 30.0–36.0)
MCV: 85.2 fL (ref 80.0–100.0)
Monocytes Absolute: 0.3 10*3/uL (ref 0.1–1.0)
Monocytes Relative: 4 %
Neutro Abs: 4.9 10*3/uL (ref 1.7–7.7)
Neutrophils Relative %: 56 %
Platelet Count: 450 10*3/uL — ABNORMAL HIGH (ref 150–400)
RBC: 4.26 MIL/uL (ref 3.87–5.11)
RDW: 14.3 % (ref 11.5–15.5)
WBC Count: 8.7 10*3/uL (ref 4.0–10.5)
nRBC: 0 % (ref 0.0–0.2)
nRBC: 0 /100{WBCs}

## 2023-07-28 LAB — IRON AND TIBC
Iron: 69 ug/dL (ref 28–170)
Saturation Ratios: 19 % (ref 10.4–31.8)
TIBC: 365 ug/dL (ref 250–450)
UIBC: 296 ug/dL

## 2023-07-28 LAB — FERRITIN: Ferritin: 50 ng/mL (ref 11–307)

## 2023-07-29 DIAGNOSIS — R7989 Other specified abnormal findings of blood chemistry: Secondary | ICD-10-CM | POA: Insufficient documentation

## 2023-09-19 ENCOUNTER — Ambulatory Visit: Payer: Medicaid Other | Admitting: Neurology

## 2023-09-19 ENCOUNTER — Encounter: Payer: Self-pay | Admitting: Neurology

## 2023-12-02 ENCOUNTER — Encounter: Payer: Self-pay | Admitting: Psychiatry

## 2023-12-02 DIAGNOSIS — F322 Major depressive disorder, single episode, severe without psychotic features: Secondary | ICD-10-CM | POA: Insufficient documentation

## 2023-12-03 ENCOUNTER — Inpatient Hospital Stay
Admission: AD | Admit: 2023-12-03 | Discharge: 2023-12-08 | DRG: 885 | Disposition: A | Discharge status: Home / Self Care | Source: Other Acute Inpatient Hospital | Attending: Psychiatry | Admitting: Psychiatry

## 2023-12-03 DIAGNOSIS — Z8041 Family history of malignant neoplasm of ovary: Secondary | ICD-10-CM | POA: Diagnosis not present

## 2023-12-03 DIAGNOSIS — Z9151 Personal history of suicidal behavior: Secondary | ICD-10-CM | POA: Diagnosis not present

## 2023-12-03 DIAGNOSIS — Z8261 Family history of arthritis: Secondary | ICD-10-CM | POA: Diagnosis not present

## 2023-12-03 DIAGNOSIS — Z825 Family history of asthma and other chronic lower respiratory diseases: Secondary | ICD-10-CM

## 2023-12-03 DIAGNOSIS — Z79899 Other long term (current) drug therapy: Secondary | ICD-10-CM | POA: Diagnosis not present

## 2023-12-03 DIAGNOSIS — F319 Bipolar disorder, unspecified: Principal | ICD-10-CM | POA: Diagnosis present

## 2023-12-03 DIAGNOSIS — Z62810 Personal history of physical and sexual abuse in childhood: Secondary | ICD-10-CM | POA: Diagnosis not present

## 2023-12-03 DIAGNOSIS — Z91013 Allergy to seafood: Secondary | ICD-10-CM

## 2023-12-03 DIAGNOSIS — Z841 Family history of disorders of kidney and ureter: Secondary | ICD-10-CM

## 2023-12-03 DIAGNOSIS — F4310 Post-traumatic stress disorder, unspecified: Secondary | ICD-10-CM | POA: Diagnosis present

## 2023-12-03 DIAGNOSIS — E669 Obesity, unspecified: Secondary | ICD-10-CM | POA: Diagnosis present

## 2023-12-03 DIAGNOSIS — F419 Anxiety disorder, unspecified: Secondary | ICD-10-CM | POA: Diagnosis present

## 2023-12-03 DIAGNOSIS — Z8249 Family history of ischemic heart disease and other diseases of the circulatory system: Secondary | ICD-10-CM | POA: Diagnosis not present

## 2023-12-03 DIAGNOSIS — F332 Major depressive disorder, recurrent severe without psychotic features: Secondary | ICD-10-CM | POA: Diagnosis present

## 2023-12-03 DIAGNOSIS — Z91048 Other nonmedicinal substance allergy status: Secondary | ICD-10-CM | POA: Diagnosis not present

## 2023-12-03 DIAGNOSIS — Z6841 Body Mass Index (BMI) 40.0 and over, adult: Secondary | ICD-10-CM | POA: Diagnosis not present

## 2023-12-03 DIAGNOSIS — Z5941 Food insecurity: Secondary | ICD-10-CM | POA: Diagnosis not present

## 2023-12-03 DIAGNOSIS — Z823 Family history of stroke: Secondary | ICD-10-CM | POA: Diagnosis not present

## 2023-12-03 DIAGNOSIS — Z560 Unemployment, unspecified: Secondary | ICD-10-CM

## 2023-12-03 DIAGNOSIS — G8929 Other chronic pain: Secondary | ICD-10-CM | POA: Diagnosis present

## 2023-12-03 DIAGNOSIS — Z791 Long term (current) use of non-steroidal anti-inflammatories (NSAID): Secondary | ICD-10-CM | POA: Diagnosis not present

## 2023-12-03 DIAGNOSIS — Z5982 Transportation insecurity: Secondary | ICD-10-CM

## 2023-12-03 DIAGNOSIS — I10 Essential (primary) hypertension: Secondary | ICD-10-CM | POA: Diagnosis present

## 2023-12-03 DIAGNOSIS — Z833 Family history of diabetes mellitus: Secondary | ICD-10-CM

## 2023-12-03 DIAGNOSIS — F3130 Bipolar disorder, current episode depressed, mild or moderate severity, unspecified: Principal | ICD-10-CM | POA: Diagnosis present

## 2023-12-03 DIAGNOSIS — Z7982 Long term (current) use of aspirin: Secondary | ICD-10-CM

## 2023-12-03 DIAGNOSIS — F3289 Other specified depressive episodes: Secondary | ICD-10-CM | POA: Diagnosis not present

## 2023-12-03 DIAGNOSIS — F322 Major depressive disorder, single episode, severe without psychotic features: Principal | ICD-10-CM | POA: Diagnosis present

## 2023-12-03 MED ORDER — DIPHENHYDRAMINE HCL 50 MG/ML IJ SOLN: 50.0000 mg | Freq: Three times a day (TID) | INTRAMUSCULAR | Status: DC | PRN

## 2023-12-03 MED ORDER — HYDROXYZINE HCL 25 MG PO TABS
25.0000 mg | ORAL_TABLET | Freq: Three times a day (TID) | ORAL | Status: DC | PRN
  Administered 2023-12-04: 25 mg via ORAL
  Filled 2023-12-03: qty 1

## 2023-12-03 MED ORDER — LORAZEPAM 2 MG/ML IJ SOLN: 2.0000 mg | Freq: Three times a day (TID) | INTRAMUSCULAR | Status: DC | PRN

## 2023-12-03 MED ORDER — ACETAMINOPHEN 325 MG PO TABS
650.0000 mg | ORAL_TABLET | Freq: Four times a day (QID) | ORAL | Status: DC | PRN
  Administered 2023-12-04: 650 mg via ORAL
  Filled 2023-12-03 (×2): qty 2

## 2023-12-03 MED ORDER — HALOPERIDOL LACTATE 5 MG/ML IJ SOLN: 5.0000 mg | Freq: Three times a day (TID) | INTRAMUSCULAR | Status: DC | PRN

## 2023-12-03 MED ORDER — HALOPERIDOL 5 MG PO TABS: 5.0000 mg | ORAL_TABLET | Freq: Three times a day (TID) | ORAL | Status: DC | PRN

## 2023-12-03 MED ORDER — DIPHENHYDRAMINE HCL 25 MG PO CAPS
50.0000 mg | ORAL_CAPSULE | Freq: Three times a day (TID) | ORAL | Status: DC | PRN
  Administered 2023-12-05 – 2023-12-07 (×3): 50 mg via ORAL
  Filled 2023-12-03 (×3): qty 2

## 2023-12-03 MED ORDER — MAGNESIUM HYDROXIDE 400 MG/5ML PO SUSP: 30.0000 mL | Freq: Every day | ORAL | Status: DC | PRN

## 2023-12-03 MED ORDER — TRAZODONE HCL 50 MG PO TABS
50.0000 mg | ORAL_TABLET | Freq: Every evening | ORAL | Status: DC | PRN
  Administered 2023-12-04 – 2023-12-07 (×4): 50 mg via ORAL
  Filled 2023-12-03 (×4): qty 1

## 2023-12-03 MED ORDER — HALOPERIDOL LACTATE 5 MG/ML IJ SOLN: 10.0000 mg | Freq: Three times a day (TID) | INTRAMUSCULAR | Status: DC | PRN

## 2023-12-03 MED ORDER — ALUM & MAG HYDROXIDE-SIMETH 200-200-20 MG/5ML PO SUSP
30.0000 mL | ORAL | Status: DC | PRN
  Filled 2023-12-03: qty 30

## 2023-12-04 ENCOUNTER — Encounter: Payer: Self-pay | Admitting: Psychiatry

## 2023-12-04 ENCOUNTER — Other Ambulatory Visit: Payer: Self-pay

## 2023-12-04 DIAGNOSIS — F3289 Other specified depressive episodes: Secondary | ICD-10-CM

## 2023-12-04 DIAGNOSIS — F319 Bipolar disorder, unspecified: Principal | ICD-10-CM | POA: Diagnosis present

## 2023-12-04 MED ORDER — LUMATEPERONE TOSYLATE 42 MG PO CAPS
42.0000 mg | ORAL_CAPSULE | Freq: Every day | ORAL | Status: DC
  Administered 2023-12-04 – 2023-12-07 (×4): 42 mg via ORAL
  Filled 2023-12-04 (×4): qty 1

## 2023-12-04 MED ORDER — PRAZOSIN HCL 1 MG PO CAPS
1.0000 mg | ORAL_CAPSULE | Freq: Every day | ORAL | Status: DC
  Administered 2023-12-04: 1 mg via ORAL
  Filled 2023-12-04: qty 1

## 2023-12-04 NOTE — Group Note (Signed)
 Date:  12/04/2023 Time:  9:52 AM  Group Topic/Focus:  Personal Choices and Values:   The focus of this group is to help patients assess and explore the importance of values in their lives, how their values affect their decisions, how they express their values and what opposes their expression.    Participation Level:  Did Not Attend  Participation Quality:    Affect:    Cognitive:    Insight:   Engagement in Group:    Modes of Intervention:    Additional Comments:    Helaman Mecca 12/04/2023, 9:52 AM

## 2023-12-04 NOTE — Tx Team (Signed)
 Initial Treatment Plan 12/04/2023 9:37 AM [name] FMW:[id number]    PATIENT STRESSORS: Financial difficulties   Marital or family conflict     PATIENT STRENGTHS: Average or above average intelligence  Capable of independent living    PATIENT IDENTIFIED PROBLEMS: Depression  Suicidal Thoughts                   DISCHARGE CRITERIA:  Verbal commitment to aftercare and medication compliance  PRELIMINARY DISCHARGE PLAN: Outpatient therapy Return to previous living arrangement  PATIENT/FAMILY INVOLVEMENT: This treatment plan has been presented to and reviewed with the patient, Candice Holder, and/or family member, .  The patient and family have been given the opportunity to ask questions and make suggestions.  Candice DELENA Burrs, RN 12/04/2023, 9:37 AM

## 2023-12-04 NOTE — Progress Notes (Signed)
   12/04/23 1000  Psych Admission Type (Psych Patients Only)  Admission Status Involuntary  Psychosocial Assessment  Patient Complaints Anxiety  Eye Contact None  Facial Expression Grimacing  Affect Depressed  Speech Logical/coherent  Interaction Avoidant  Motor Activity Slow  Appearance/Hygiene Unremarkable  Behavior Characteristics Cooperative;Appropriate to situation  Mood Depressed  Aggressive Behavior  Targets Self  Effect No apparent injury  Thought Process  Coherency WDL  Content WDL  Delusions None reported or observed  Perception WDL  Hallucination None reported or observed  Judgment WDL  Confusion WDL  Danger to Self  Current suicidal ideation? Passive  Self-Injurious Behavior No self-injurious ideation or behavior indicators observed or expressed   Agreement Not to Harm Self Yes  Description of Agreement Verbal  Danger to Others  Danger to Others None reported or observed

## 2023-12-04 NOTE — Plan of Care (Signed)
 Problem: Education: Goal: Knowledge of [facility] Education information/materials will improve 12/04/2023 1233 by Shirley Jon FALCON, RN Outcome: Not Progressing 12/04/2023 1233 by Shirley Jon FALCON, RN Outcome: Not Progressing Goal: Emotional status will improve 12/04/2023 1233 by Shirley Jon FALCON, RN Outcome: Not Progressing 12/04/2023 1233 by Shirley Jon FALCON, RN Outcome: Not Progressing Goal: Mental status will improve 12/04/2023 1233 by Shirley Jon FALCON, RN Outcome: Not Progressing 12/04/2023 1233 by Shirley Jon FALCON, RN Outcome: Not Progressing Goal: Verbalization of understanding the information provided will improve 12/04/2023 1233 by Shirley Jon FALCON, RN Outcome: Not Progressing 12/04/2023 1233 by Shirley Jon FALCON, RN Outcome: Not Progressing   Problem: Activity: Goal: Interest or engagement in activities will improve 12/04/2023 1233 by Shirley Jon FALCON, RN Outcome: Not Progressing 12/04/2023 1233 by Shirley Jon FALCON, RN Outcome: Not Progressing Goal: Sleeping patterns will improve 12/04/2023 1233 by Shirley Jon FALCON, RN Outcome: Not Progressing 12/04/2023 1233 by Shirley Jon FALCON, RN Outcome: Not Progressing   Problem: Coping: Goal: Ability to verbalize frustrations and anger appropriately will improve 12/04/2023 1233 by Shirley Jon FALCON, RN Outcome: Not Progressing 12/04/2023 1233 by Shirley Jon FALCON, RN Outcome: Not Progressing Goal: Ability to demonstrate self-control will improve 12/04/2023 1233 by Shirley Jon FALCON, RN Outcome: Not Progressing 12/04/2023 1233 by Shirley Jon FALCON, RN Outcome: Not Progressing   Problem: Health Behavior/Discharge Planning: Goal: Identification of resources available to assist in meeting health care needs will improve 12/04/2023 1233 by Shirley Jon FALCON, RN Outcome: Not Progressing 12/04/2023 1233 by Shirley Jon FALCON, RN Outcome: Not Progressing Goal: Compliance with treatment plan for underlying cause of  condition will improve 12/04/2023 1233 by Shirley Jon FALCON, RN Outcome: Not Progressing 12/04/2023 1233 by Shirley Jon FALCON, RN Outcome: Not Progressing   Problem: Physical Regulation: Goal: Ability to maintain clinical measurements within normal limits will improve 12/04/2023 1233 by Shirley Jon FALCON, RN Outcome: Not Progressing 12/04/2023 1233 by Shirley Jon FALCON, RN Outcome: Not Progressing   Problem: Safety: Goal: Periods of time without injury will increase 12/04/2023 1233 by Shirley Jon FALCON, RN Outcome: Not Progressing 12/04/2023 1233 by Shirley Jon FALCON, RN Outcome: Not Progressing   Problem: Education: Goal: Utilization of techniques to improve thought processes will improve 12/04/2023 1233 by Shirley Jon FALCON, RN Outcome: Not Progressing 12/04/2023 1233 by Shirley Jon FALCON, RN Outcome: Not Progressing Goal: Knowledge of the prescribed therapeutic regimen will improve 12/04/2023 1233 by Shirley Jon FALCON, RN Outcome: Not Progressing 12/04/2023 1233 by Shirley Jon FALCON, RN Outcome: Not Progressing   Problem: Activity: Goal: Interest or engagement in leisure activities will improve 12/04/2023 1233 by Shirley Jon FALCON, RN Outcome: Not Progressing 12/04/2023 1233 by Shirley Jon FALCON, RN Outcome: Not Progressing Goal: Imbalance in normal sleep/wake cycle will improve 12/04/2023 1233 by Shirley Jon FALCON, RN Outcome: Not Progressing 12/04/2023 1233 by Shirley Jon FALCON, RN Outcome: Not Progressing   Problem: Coping: Goal: Coping ability will improve 12/04/2023 1233 by Shirley Jon FALCON, RN Outcome: Not Progressing 12/04/2023 1233 by Shirley Jon FALCON, RN Outcome: Not Progressing Goal: Will verbalize feelings 12/04/2023 1233 by Shirley Jon FALCON, RN Outcome: Not Progressing 12/04/2023 1233 by Shirley Jon FALCON, RN Outcome: Not Progressing   Problem: Health Behavior/Discharge Planning: Goal: Ability to make decisions will improve 12/04/2023 1233 by Shirley Jon FALCON, RN Outcome: Not Progressing 12/04/2023 1233 by Shirley Jon FALCON, RN Outcome: Not Progressing Goal: Compliance with therapeutic regimen will improve 12/04/2023 1233 by Shirley Jon FALCON, RN Outcome: Not Progressing 12/04/2023 1233  by Shirley Jon FALCON, RN Outcome: Not Progressing   Problem: Role Relationship: Goal: Will demonstrate positive changes in social behaviors and relationships 12/04/2023 1233 by Shirley Jon FALCON, RN Outcome: Not Progressing 12/04/2023 1233 by Shirley Jon FALCON, RN Outcome: Not Progressing   Problem: Safety: Goal: Ability to disclose and discuss suicidal ideas will improve 12/04/2023 1233 by Shirley Jon FALCON, RN Outcome: Not Progressing 12/04/2023 1233 by Shirley Jon FALCON, RN Outcome: Not Progressing Goal: Ability to identify and utilize support systems that promote safety will improve 12/04/2023 1233 by Shirley Jon FALCON, RN Outcome: Not Progressing 12/04/2023 1233 by Shirley Jon FALCON, RN Outcome: Not Progressing   Problem: Self-Concept: Goal: Will verbalize positive feelings about self 12/04/2023 1233 by Shirley Jon FALCON, RN Outcome: Not Progressing 12/04/2023 1233 by Shirley Jon FALCON, RN Outcome: Not Progressing Goal: Level of anxiety will decrease 12/04/2023 1233 by Shirley Jon FALCON, RN Outcome: Not Progressing 12/04/2023 1233 by Shirley Jon FALCON, RN Outcome: Not Progressing
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
## 2023-12-04 NOTE — Plan of Care (Signed)
 Problem: Education: Goal: Knowledge of [facility] Education information/materials will improve 12/04/2023 1641 by Shirley Jon FALCON, RN Outcome: Not Progressing 12/04/2023 1233 by Shirley Jon FALCON, RN Outcome: Not Progressing 12/04/2023 1233 by Shirley Jon FALCON, RN Outcome: Not Progressing Goal: Emotional status will improve 12/04/2023 1641 by Shirley Jon FALCON, RN Outcome: Not Progressing 12/04/2023 1233 by Shirley Jon FALCON, RN Outcome: Not Progressing 12/04/2023 1233 by Shirley Jon FALCON, RN Outcome: Not Progressing Goal: Mental status will improve 12/04/2023 1641 by Shirley Jon FALCON, RN Outcome: Not Progressing 12/04/2023 1233 by Shirley Jon FALCON, RN Outcome: Not Progressing 12/04/2023 1233 by Shirley Jon FALCON, RN Outcome: Not Progressing Goal: Verbalization of understanding the information provided will improve 12/04/2023 1641 by Shirley Jon FALCON, RN Outcome: Not Progressing 12/04/2023 1233 by Shirley Jon FALCON, RN Outcome: Not Progressing 12/04/2023 1233 by Shirley Jon FALCON, RN Outcome: Not Progressing   Problem: Activity: Goal: Interest or engagement in activities will improve 12/04/2023 1641 by Shirley Jon FALCON, RN Outcome: Not Progressing 12/04/2023 1233 by Shirley Jon FALCON, RN Outcome: Not Progressing 12/04/2023 1233 by Shirley Jon FALCON, RN Outcome: Not Progressing Goal: Sleeping patterns will improve 12/04/2023 1641 by Shirley Jon FALCON, RN Outcome: Not Progressing 12/04/2023 1233 by Shirley Jon FALCON, RN Outcome: Not Progressing 12/04/2023 1233 by Shirley Jon FALCON, RN Outcome: Not Progressing   Problem: Coping: Goal: Ability to verbalize frustrations and anger appropriately will improve 12/04/2023 1641 by Shirley Jon FALCON, RN Outcome: Not Progressing 12/04/2023 1233 by Shirley Jon FALCON, RN Outcome: Not Progressing 12/04/2023 1233 by Shirley Jon FALCON, RN Outcome: Not Progressing Goal: Ability to demonstrate self-control will improve 12/04/2023 1641  by Shirley Jon FALCON, RN Outcome: Not Progressing 12/04/2023 1233 by Shirley Jon FALCON, RN Outcome: Not Progressing 12/04/2023 1233 by Shirley Jon FALCON, RN Outcome: Not Progressing   Problem: Health Behavior/Discharge Planning: Goal: Identification of resources available to assist in meeting health care needs will improve 12/04/2023 1641 by Shirley Jon FALCON, RN Outcome: Not Progressing 12/04/2023 1233 by Shirley Jon FALCON, RN Outcome: Not Progressing 12/04/2023 1233 by Shirley Jon FALCON, RN Outcome: Not Progressing Goal: Compliance with treatment plan for underlying cause of condition will improve 12/04/2023 1641 by Shirley Jon FALCON, RN Outcome: Not Progressing 12/04/2023 1233 by Shirley Jon FALCON, RN Outcome: Not Progressing 12/04/2023 1233 by Shirley Jon FALCON, RN Outcome: Not Progressing   Problem: Physical Regulation: Goal: Ability to maintain clinical measurements within normal limits will improve 12/04/2023 1641 by Shirley Jon FALCON, RN Outcome: Not Progressing 12/04/2023 1233 by Shirley Jon FALCON, RN Outcome: Not Progressing 12/04/2023 1233 by Shirley Jon FALCON, RN Outcome: Not Progressing   Problem: Safety: Goal: Periods of time without injury will increase 12/04/2023 1641 by Shirley Jon FALCON, RN Outcome: Not Progressing 12/04/2023 1233 by Shirley Jon FALCON, RN Outcome: Not Progressing 12/04/2023 1233 by Shirley Jon FALCON, RN Outcome: Not Progressing   Problem: Education: Goal: Utilization of techniques to improve thought processes will improve 12/04/2023 1641 by Shirley Jon FALCON, RN Outcome: Not Progressing 12/04/2023 1233 by Shirley Jon FALCON, RN Outcome: Not Progressing 12/04/2023 1233 by Shirley Jon FALCON, RN Outcome: Not Progressing Goal: Knowledge of the prescribed therapeutic regimen will improve 12/04/2023 1641 by Shirley Jon FALCON, RN Outcome: Not Progressing 12/04/2023 1233 by Shirley Jon FALCON, RN Outcome: Not Progressing 12/04/2023 1233 by Shirley Jon FALCON,  RN Outcome: Not Progressing   Problem: Activity: Goal: Interest or engagement in leisure activities will improve 12/04/2023 1641 by Shirley Jon FALCON, RN Outcome: Not Progressing 12/04/2023 1233 by  Shirley Jon FALCON, RN Outcome: Not Progressing 12/04/2023 1233 by Shirley Jon FALCON, RN Outcome: Not Progressing Goal: Imbalance in normal sleep/wake cycle will improve 12/04/2023 1641 by Shirley Jon FALCON, RN Outcome: Not Progressing 12/04/2023 1233 by Shirley Jon FALCON, RN Outcome: Not Progressing 12/04/2023 1233 by Shirley Jon FALCON, RN Outcome: Not Progressing   Problem: Coping: Goal: Coping ability will improve 12/04/2023 1641 by Shirley Jon FALCON, RN Outcome: Not Progressing 12/04/2023 1233 by Shirley Jon FALCON, RN Outcome: Not Progressing 12/04/2023 1233 by Shirley Jon FALCON, RN Outcome: Not Progressing Goal: Will verbalize feelings 12/04/2023 1641 by Shirley Jon FALCON, RN Outcome: Not Progressing 12/04/2023 1233 by Shirley Jon FALCON, RN Outcome: Not Progressing 12/04/2023 1233 by Shirley Jon FALCON, RN Outcome: Not Progressing   Problem: Health Behavior/Discharge Planning: Goal: Ability to make decisions will improve 12/04/2023 1641 by Shirley Jon FALCON, RN Outcome: Not Progressing 12/04/2023 1233 by Shirley Jon FALCON, RN Outcome: Not Progressing 12/04/2023 1233 by Shirley Jon FALCON, RN Outcome: Not Progressing Goal: Compliance with therapeutic regimen will improve 12/04/2023 1641 by Shirley Jon FALCON, RN Outcome: Not Progressing 12/04/2023 1233 by Shirley Jon FALCON, RN Outcome: Not Progressing 12/04/2023 1233 by Shirley Jon FALCON, RN Outcome: Not Progressing   Problem: Role Relationship: Goal: Will demonstrate positive changes in social behaviors and relationships 12/04/2023 1641 by Shirley Jon FALCON, RN Outcome: Not Progressing 12/04/2023 1233 by Shirley Jon FALCON, RN Outcome: Not Progressing 12/04/2023 1233 by Shirley Jon FALCON, RN Outcome: Not Progressing   Problem:  Safety: Goal: Ability to disclose and discuss suicidal ideas will improve 12/04/2023 1641 by Shirley Jon FALCON, RN Outcome: Not Progressing 12/04/2023 1233 by Shirley Jon FALCON, RN Outcome: Not Progressing 12/04/2023 1233 by Shirley Jon FALCON, RN Outcome: Not Progressing Goal: Ability to identify and utilize support systems that promote safety will improve 12/04/2023 1641 by Shirley Jon FALCON, RN Outcome: Not Progressing 12/04/2023 1233 by Shirley Jon FALCON, RN Outcome: Not Progressing 12/04/2023 1233 by Shirley Jon FALCON, RN Outcome: Not Progressing   Problem: Self-Concept: Goal: Will verbalize positive feelings about self 12/04/2023 1641 by Shirley Jon FALCON, RN Outcome: Not Progressing 12/04/2023 1233 by Shirley Jon FALCON, RN Outcome: Not Progressing 12/04/2023 1233 by Shirley Jon FALCON, RN Outcome: Not Progressing Goal: Level of anxiety will decrease 12/04/2023 1641 by Shirley Jon FALCON, RN Outcome: Not Progressing 12/04/2023 1233 by Shirley Jon FALCON, RN Outcome: Not Progressing 12/04/2023 1233 by Shirley Jon FALCON, RN Outcome: Not Progressing

## 2023-12-04 NOTE — Group Note (Signed)
 Recreation Therapy Group Note   Group Topic:Health and Wellness  Group Date: 12/04/2023 Start Time: 1045 End Time: 1135 Facilitators: Celestia Jeoffrey BRAVO, LRT, CTRS Location: Courtyard  Group Description: [employer]. LRT and patients played games of basketball, drew with chalk, and played corn hole while outside in the courtyard while getting fresh air and sunlight. Music was being played in the background. LRT and peers conversed about different games they have played before, what they do in their free time and anything else that is on their minds. LRT encouraged pts to drink water after being outside, sweating and getting their heart rate up.  Goal Area(s) Addressed: Patient will build on frustration tolerance skills. Patients will partake in a competitive play game with peers. Patients will gain knowledge of new leisure interest/hobby.    Affect/Mood: N/A   Participation Level: Did not attend    Clinical Observations/Individualized Feedback: Patient did not attend group.   Plan: Continue to engage patient in RT group sessions 2-3x/week.   Jeoffrey BRAVO Celestia, LRT, CTRS 12/04/2023 1:43 PM

## 2023-12-04 NOTE — Progress Notes (Signed)
 Patient admitted under IVC from [facility] after attempted intentional overdose of unknown amount of pills per patient report. She endorses depression, suicidal thoughts, and anxiety. She denies AVH. She was cooperative with admission process.

## 2023-12-04 NOTE — Plan of Care (Signed)
?  Problem: Education: ?Goal: Knowledge of [facility] Education information/materials will improve ?Outcome: Not Progressing ?Goal: Emotional status will improve ?Outcome: Not Progressing ?Goal: Mental status will improve ?Outcome: Not Progressing ?Goal: Verbalization of understanding the information provided will improve ?Outcome: Not Progressing ?  ?Problem: Activity: ?Goal: Interest or engagement in activities will improve ?Outcome: Not Progressing ?Goal: Sleeping patterns will improve ?Outcome: Not Progressing ?  ?Problem: Coping: ?Goal: Ability to verbalize frustrations and anger appropriately will improve ?Outcome: Not Progressing ?Goal: Ability to demonstrate self-control will improve ?Outcome: Not Progressing ?  ?Problem: Health Behavior/Discharge Planning: ?Goal: Identification of resources available to assist in meeting health care needs will improve ?Outcome: Not Progressing ?Goal: Compliance with treatment plan for underlying cause of condition will improve ?Outcome: Not Progressing ?  ?Problem: Physical Regulation: ?Goal: Ability to maintain clinical measurements within normal limits will improve ?Outcome: Not Progressing ?  ?Problem: Safety: ?Goal: Periods of time without injury will increase ?Outcome: Not Progressing ?  ?Problem: Education: ?Goal: Utilization of techniques to improve thought processes will improve ?Outcome: Not Progressing ?Goal: Knowledge of the prescribed therapeutic regimen will improve ?Outcome: Not Progressing ?  ?Problem: Activity: ?Goal: Interest or engagement in leisure activities will improve ?Outcome: Not Progressing ?Goal: Imbalance in normal sleep/wake cycle will improve ?Outcome: Not Progressing ?  ?Problem: Coping: ?Goal: Coping ability will improve ?Outcome: Not Progressing ?Goal: Will verbalize feelings ?Outcome: Not Progressing ?  ?Problem: Health Behavior/Discharge Planning: ?Goal: Ability to make decisions will improve ?Outcome: Not Progressing ?Goal:  Compliance with therapeutic regimen will improve ?Outcome: Not Progressing ?  ?Problem: Role Relationship: ?Goal: Will demonstrate positive changes in social behaviors and relationships ?Outcome: Not Progressing ?  ?Problem: Safety: ?Goal: Ability to disclose and discuss suicidal ideas will improve ?Outcome: Not Progressing ?Goal: Ability to identify and utilize support systems that promote safety will improve ?Outcome: Not Progressing ?  ?Problem: Self-Concept: ?Goal: Will verbalize positive feelings about self ?Outcome: Not Progressing ?Goal: Level of anxiety will decrease ?Outcome: Not Progressing ?  ?
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
## 2023-12-04 NOTE — BH IP Treatment Plan (Addendum)
 Interdisciplinary Treatment and Diagnostic Plan Update  12/04/2023 Time of Session: 10:26AM Candice Holder MRN: [id number]  Principal Diagnosis: Bipolar depression (HCC)  Secondary Diagnoses: Principal Problem:   Bipolar depression (HCC)   Current Medications:  Current Facility-Administered Medications  Medication Dose Route Frequency Provider Last Rate Last Admin   acetaminophen  (TYLENOL ) tablet 650 mg  650 mg Oral Q6H PRN Bobbitt, Shalon E, NP   650 mg at 12/04/23 1059   alum & mag hydroxide-simeth (MAALOX/MYLANTA) 200-200-20 MG/5ML suspension 30 mL  30 mL Oral Q4H PRN Bobbitt, Shalon E, NP       haloperidol (HALDOL) tablet 5 mg  5 mg Oral TID PRN Bobbitt, Shalon E, NP       And   diphenhydrAMINE (BENADRYL) capsule 50 mg  50 mg Oral TID PRN Bobbitt, Shalon E, NP       haloperidol lactate (HALDOL) injection 5 mg  5 mg Intramuscular TID PRN Bobbitt, Shalon E, NP       And   diphenhydrAMINE (BENADRYL) injection 50 mg  50 mg Intramuscular TID PRN Bobbitt, Shalon E, NP       And   LORazepam (ATIVAN) injection 2 mg  2 mg Intramuscular TID PRN Bobbitt, Shalon E, NP       haloperidol lactate (HALDOL) injection 10 mg  10 mg Intramuscular TID PRN Bobbitt, Shalon E, NP       And   diphenhydrAMINE (BENADRYL) injection 50 mg  50 mg Intramuscular TID PRN Bobbitt, Shalon E, NP       And   LORazepam (ATIVAN) injection 2 mg  2 mg Intramuscular TID PRN Bobbitt, Shalon E, NP       hydrOXYzine (ATARAX) tablet 25 mg  25 mg Oral TID PRN Bobbitt, Shalon E, NP       lumateperone tosylate (CAPLYTA) capsule 42 mg  42 mg Oral QHS Cleotilde Hoy HERO, NP       magnesium hydroxide (MILK OF MAGNESIA) suspension 30 mL  30 mL Oral Daily PRN Bobbitt, Shalon E, NP       traZODone (DESYREL) tablet 50 mg  50 mg Oral QHS PRN Bobbitt, Shalon E, NP       PTA Medications: Medications Prior to Admission  Medication Sig Dispense Refill Last Dose/Taking   buPROPion (WELLBUTRIN) 100 MG tablet Take 100 mg by mouth 2 (two)  times daily.   Past Week   aspirin EC 81 MG tablet Take 81 mg by mouth daily. Swallow whole.      atenolol (TENORMIN) 25 MG tablet Take by mouth daily.      citalopram (CELEXA) 20 MG tablet Take 20 mg by mouth daily.   Unknown   meloxicam (MOBIC) 15 MG tablet Take 15 mg by mouth daily.      tiZANidine (ZANAFLEX) 4 MG capsule Take 4 mg by mouth 3 (three) times daily.       Patient Stressors: Financial difficulties   Marital or family conflict    Patient Strengths: Average or above average intelligence  Capable of independent living   Treatment Modalities: Medication Management, Group therapy, Case management,  1 to 1 session with clinician, Psychoeducation, Recreational therapy.   Physician Treatment Plan for Primary Diagnosis: Bipolar depression (HCC) Long Term Goal(s): Improvement in symptoms so as ready for discharge   Short Term Goals: Ability to identify changes in lifestyle to reduce recurrence of condition will improve  Medication Management: Evaluate patient's response, side effects, and tolerance of medication regimen.  Therapeutic Interventions: 1 to 1 sessions, Unit  Group sessions and Medication administration.  Evaluation of Outcomes: Not Met  Physician Treatment Plan for Secondary Diagnosis: Principal Problem:   Bipolar depression (HCC)  Long Term Goal(s): Improvement in symptoms so as ready for discharge   Short Term Goals: Ability to identify changes in lifestyle to reduce recurrence of condition will improve     Medication Management: Evaluate patient's response, side effects, and tolerance of medication regimen.  Therapeutic Interventions: 1 to 1 sessions, Unit Group sessions and Medication administration.  Evaluation of Outcomes: Not Met   RN Treatment Plan for Primary Diagnosis: Bipolar depression (HCC) Long Term Goal(s): Knowledge of disease and therapeutic regimen to maintain health will improve  Short Term Goals: Ability to demonstrate self-control,  Ability to participate in decision making will improve, Ability to verbalize feelings will improve, Ability to disclose and discuss suicidal ideas, Ability to identify and develop effective coping behaviors will improve, and Compliance with prescribed medications will improve  Medication Management: RN will administer medications as ordered by provider, will assess and evaluate patient's response and provide education to patient for prescribed medication. RN will report any adverse and/or side effects to prescribing provider.  Therapeutic Interventions: 1 on 1 counseling sessions, Psychoeducation, Medication administration, Evaluate responses to treatment, Monitor vital signs and CBGs as ordered, Perform/monitor CIWA, COWS, AIMS and Fall Risk screenings as ordered, Perform wound care treatments as ordered.  Evaluation of Outcomes: Not Met   LCSW Treatment Plan for Primary Diagnosis: Bipolar depression (HCC) Long Term Goal(s): Safe transition to appropriate next level of care at discharge, Engage patient in therapeutic group addressing interpersonal concerns.  Short Term Goals: Engage patient in aftercare planning with referrals and resources, Increase social support, Increase ability to appropriately verbalize feelings, Increase emotional regulation, Facilitate acceptance of mental health diagnosis and concerns, and Increase skills for wellness and recovery  Therapeutic Interventions: Assess for all discharge needs, 1 to 1 time with Social worker, Explore available resources and support systems, Assess for adequacy in community support network, Educate family and significant other(s) on suicide prevention, Complete Psychosocial Assessment, Interpersonal group therapy.  Evaluation of Outcomes: Not Met   Progress in Treatment: Attending groups: No. Participating in groups: No. Taking medication as prescribed: Yes. Toleration medication: Yes. Family/Significant other contact made: No, will  contact:  once permission has been granted. Patient understands diagnosis: Yes. Discussing patient identified problems/goals with staff: Yes. Medical problems stabilized or resolved: Yes. Denies suicidal/homicidal ideation: No. Issues/concerns per patient self-inventory: No. Other: none  New problem(s) identified: No, Describe:  none  New Short Term/Long Term Goal(s): detox, elimination of symptoms of psychosis, medication management for mood stabilization; elimination of SI thoughts; development of comprehensive mental wellness/sobriety plan.   Patient Goals:  I just want to quiet the noise in my head so I can figure out how to move forward  Discharge Plan or Barriers: CSW to assist in the development of appropriate discharge plans.   Reason for Continuation of Hospitalization: Anxiety Depression Medical Issues Medication stabilization Suicidal ideation  Estimated Length of Stay:  1-7 days  Last 3 [place] Suicide Severity Risk Score: Flowsheet Row Admission (Current) from 12/03/2023 in [facility] INPATIENT BEHAV[facility]AL MEDICINE ED from 05/01/2023 in [facility] Emergency Department at [facility] ED from 05/13/2021 in [facility] Emergency Department at [facility]  C-SSRS RISK CATEGORY High Risk No Risk No Risk    Last PHQ 2/9 Scores:     No data to display          Scribe for  Treatment Team: Sherryle JINNY Margo, KEN 12/04/2023 12:32 PM

## 2023-12-04 NOTE — Progress Notes (Signed)
   12/04/23 1545  Spiritual Encounters  Type of Visit Initial  Care provided to: Patient  Conversation partners present during encounter Nurse  Reason for visit Routine spiritual support  OnCall Visit No   Chaplain visited the Unit and was introduced to patient by staff member who asked patient if she'd like a Chaplain visit.  Patient declined spiritual care at this time.    Rev. Rana M. Nicholaus, M.Div. Chaplain Resident  [facility]

## 2023-12-04 NOTE — Group Note (Signed)
 Date:  12/04/2023 Time:  9:54 PM  Group Topic/Focus:  Wrap-Up Group:   The focus of this group is to help patients review their daily goal of treatment and discuss progress on daily workbooks.    Participation Level:  Active  Participation Quality:  Appropriate, Attentive, Sharing, and Supportive  Affect:  Appropriate  Cognitive:  Appropriate  Insight: Appropriate and Good  Engagement in Group:  Engaged and Supportive  Modes of Intervention:  Discussion and Role-play  Additional Comments:     Kerri Katz 12/04/2023, 9:54 PM

## 2023-12-04 NOTE — Group Note (Signed)
 [facility] LCSW Group Therapy Note    Group Date: 12/04/2023 Start Time: 1300 End Time: 1400  Type of Therapy and Topic:  Group Therapy:  Overcoming Obstacles  Participation Level:  [facility] PARTICIPATION LEVEL: Did Not Attend   Description of Group:   In this group patients will be encouraged to explore what they see as obstacles to their own wellness and recovery. They will be guided to discuss their thoughts, feelings, and behaviors related to these obstacles. The group will process together ways to cope with barriers, with attention given to specific choices patients can make. Each patient will be challenged to identify changes they are motivated to make in order to overcome their obstacles. This group will be process-oriented, with patients participating in exploration of their own experiences as well as giving and receiving support and challenge from other group members.  Therapeutic Goals: 1. Patient will identify personal and current obstacles as they relate to admission. 2. Patient will identify barriers that currently interfere with their wellness or overcoming obstacles.  3. Patient will identify feelings, thought process and behaviors related to these barriers. 4. Patient will identify two changes they are willing to make to overcome these obstacles:    Summary of Patient Progress X   Therapeutic Modalities:   Cognitive Behavioral Therapy Solution Focused Therapy Motivational Interviewing Relapse Prevention Therapy   Nadara JONELLE Fam, LCSW

## 2023-12-05 MED ORDER — GABAPENTIN 300 MG PO CAPS
300.0000 mg | ORAL_CAPSULE | Freq: Three times a day (TID) | ORAL | Status: DC
  Administered 2023-12-05 – 2023-12-08 (×9): 300 mg via ORAL
  Filled 2023-12-05 (×9): qty 1

## 2023-12-05 MED ORDER — PRAZOSIN HCL 2 MG PO CAPS
2.0000 mg | ORAL_CAPSULE | Freq: Every day | ORAL | Status: DC
  Administered 2023-12-05 – 2023-12-07 (×3): 2 mg via ORAL
  Filled 2023-12-05 (×3): qty 1

## 2023-12-05 NOTE — Progress Notes (Signed)
 Pt calm and pleasant during assessment denying SI/HI/AVH. Pt endorsees anxiety and depression. Pt observed by this Clinical research associate interacting appropriately with staff and peers on the unit. Pt compliant with medication administration per MD orders. Pt given education, support, and encouragement to be active in her treatment plan. Pt being monitored Q 15 minutes for safety per unit protocol, remains safe on the unit

## 2023-12-05 NOTE — Group Note (Signed)
 Date:  12/05/2023 Time:  9:04 PM  Group Topic/Focus:  Stages of Change:   The focus of this group is to explain the stages of change and help patients identify changes they want to make upon discharge. Wrap-Up Group:   The focus of this group is to help patients review their daily goal of treatment and discuss progress on daily workbooks.    Participation Level:  Did Not Attend   Larrie Leita BRAVO 12/05/2023, 9:04 PM

## 2023-12-05 NOTE — Progress Notes (Signed)
 [facility] MD Progress Note  12/05/2023 1:17 PM Glorious Flicker  MRN:  [id number] Subjective:  Candice Holder is a 38yo female who presents to [facility] per [facility] petition following intentional overdose attempt.   Patient seen today for follow-up psychiatric evaluation. Upon approach, patient is noted to be lying in bed, presentation dysphoric with flat affect and minimal engagement.  Patient reports tolerating Caplyta 42 mg by mouth daily well with no side effects. She describes persistent low mood and poor motivation, stating she feels quote "pretty much the same" end quote. She remains isolated from peers and is not participating in group programming. Patient endorses chronic generalized back pain but denies any new or acute physical complaints. She reports awakening from a nightmare overnight. Despite ongoing symptoms of depression, she denies suicidal or homicidal ideation and also denies hallucinations, paranoia, or delusions.  Mental Status Exam: Appearance unkempt, behavior withdrawn and isolated. Speech soft and sparse. Mood dysphoric, affect flat. Thought process linear but slow. Thought content without hallucinations, paranoia, or delusions. Denies SI/HI. Insight limited. Judgment fair. Cognition grossly intact.  Denies medication side effects and there are none noted. Medication education provided to include risks, benefits, and side effects. Patient remains appropriate for the inpatient psychiatric setting for safety, medication management, and symptom stabilization.  Medications:  Continue Caplyta 42 mg PO daily  Prazosin increased to 2 mg PO nightly to target nightmares  Continues to utilize PRN trazodone for sleep as needed  Plan is to monitor for clinical response. Patient continues to require inpatient care due to significant depressive symptoms and recent overdose attempt.    Principal Problem: Bipolar depression (HCC) Diagnosis: Principal Problem:    Bipolar depression (HCC)  Total Time spent with patient: 30 minutes  Past Psychiatric History:  - Past suicide attempts reported, including one overdose as a teenager - Long-standing mental health issues - History of multiple medication trials: highest dose Prozac, Wellbutrin, Seroquel, trazodone - Currently taking medications but unclear on full regimen - Expresses desire to "find what works"   Past Medical History:  Past Medical History:  Diagnosis Date   Anxiety    Depression    Hypertension    Obesity    VTE (venous thromboembolism)     Past Surgical History:  Procedure Laterality Date   CESAREAN SECTION     X 2   Family History:  Family History  Problem Relation Age of Onset   Arthritis Mother    COPD Mother    Diabetes Mother    Hypertension Mother    Heart disease Mother    Cerebral aneurysm Mother    Arthritis Maternal Aunt    Ovarian cancer Maternal Aunt    COPD Maternal Aunt    Diabetes Maternal Aunt    Hypertension Maternal Aunt    Arthritis Maternal Grandmother    Diabetes Maternal Grandmother    Hypertension Brother    CVA Brother    Hypertension Brother    Hypertension Half-Brother    Hypertension Half-Brother    Hypertension Sister    Hypertension Sister    Hypertension Sister    Hypertension Sister    Renal Disease Sister    Hypertension Half-Sister    Hypertension Half-Sister    Crohn's disease Half-Sister    Irritable bowel syndrome Half-Sister    Hypertension Half-Sister     Social History:  Social History   Substance and Sexual Activity  Alcohol Use Not Currently   Comment: social     Social History  Substance and Sexual Activity  Drug Use No    Social History   Socioeconomic History   Marital status: Single    Spouse name: Not on file   Number of children: 2   Years of education: GED + CURRENTLY ENROLLED IN COLLEGE   Highest education level: Not on file  Occupational History   Occupation: UNEMPLOYED AS OF DOS   Tobacco Use   Smoking status: Never   Smokeless tobacco: Never  Vaping Use   Vaping status: Never Used  Substance and Sexual Activity   Alcohol use: Not Currently    Comment: social   Drug use: No   Sexual activity: Not Currently  Other Topics Concern   Not on file  Social History Narrative   Not on file   Social Drivers of Health   Financial Resource Strain: Not on file  Food Insecurity: Food Insecurity Present (12/04/2023)   Hunger Vital Sign    Worried About Running Out of Food in the Last Year: Sometimes true    Ran Out of Food in the Last Year: Sometimes true  Transportation Needs: Unmet Transportation Needs (12/04/2023)   PRAPARE - Administrator, Civil Service (Medical): Yes    Lack of Transportation (Non-Medical): Yes  Physical Activity: Not on file  Stress: Not on file  Social Connections: Not on file                          Sleep: Poor Estimated Sleeping Duration (Last 24 Hours): 6.75-9.75 hours  Appetite:  Fair  Current Medications: Current Facility-Administered Medications  Medication Dose Route Frequency Provider Last Rate Last Admin   acetaminophen  (TYLENOL ) tablet 650 mg  650 mg Oral Q6H PRN Bobbitt, Shalon E, NP   650 mg at 12/04/23 1059   alum & mag hydroxide-simeth (MAALOX/MYLANTA) 200-200-20 MG/5ML suspension 30 mL  30 mL Oral Q4H PRN Bobbitt, Shalon E, NP       haloperidol (HALDOL) tablet 5 mg  5 mg Oral TID PRN Bobbitt, Shalon E, NP       And   diphenhydrAMINE (BENADRYL) capsule 50 mg  50 mg Oral TID PRN Bobbitt, Shalon E, NP       haloperidol lactate (HALDOL) injection 5 mg  5 mg Intramuscular TID PRN Bobbitt, Shalon E, NP       And   diphenhydrAMINE (BENADRYL) injection 50 mg  50 mg Intramuscular TID PRN Bobbitt, Shalon E, NP       And   LORazepam (ATIVAN) injection 2 mg  2 mg Intramuscular TID PRN Bobbitt, Shalon E, NP       haloperidol lactate (HALDOL) injection 10 mg  10 mg Intramuscular TID PRN Bobbitt, Shalon E, NP        And   diphenhydrAMINE (BENADRYL) injection 50 mg  50 mg Intramuscular TID PRN Bobbitt, Shalon E, NP       And   LORazepam (ATIVAN) injection 2 mg  2 mg Intramuscular TID PRN Bobbitt, Shalon E, NP       hydrOXYzine (ATARAX) tablet 25 mg  25 mg Oral TID PRN Bobbitt, Shalon E, NP   25 mg at 12/04/23 2114   lumateperone tosylate (CAPLYTA) capsule 42 mg  42 mg Oral QHS Cleotilde Hoy HERO, NP   42 mg at 12/04/23 2115   magnesium hydroxide (MILK OF MAGNESIA) suspension 30 mL  30 mL Oral Daily PRN Bobbitt, Shalon E, NP       prazosin (MINIPRESS) capsule  2 mg  2 mg Oral QHS Cleotilde Hoy HERO, NP       traZODone (DESYREL) tablet 50 mg  50 mg Oral QHS PRN Bobbitt, Shalon E, NP   50 mg at 12/04/23 2114    Lab Results: No results found for this or any previous visit (from the past 48 hours).  Blood Alcohol level:  Lab Results  Component Value Date   ETH 265 (H) 11/12/2017      Musculoskeletal: Strength & Muscle Tone: within normal limits Gait & Station: normal   Psychiatric Specialty Exam:  Presentation  General Appearance:  Fairly Groomed  Eye Contact: Fleeting  Speech: Slow  Speech Volume: Decreased  Handedness:No data recorded  Mood and Affect  Mood: Dysphoric  Affect: Flat   Thought Process  Thought Processes: Linear  Descriptions of Associations:Intact  Orientation:Full (Time, Place and Person)  Thought Content:Logical  History of Schizophrenia/Schizoaffective disorder:No data recorded Duration of Psychotic Symptoms:No data recorded Hallucinations:Hallucinations: None  Ideas of Reference:None  Suicidal Thoughts:Suicidal Thoughts: Yes, Passive SI Active Intent and/or Plan: Without Intent  Homicidal Thoughts:Homicidal Thoughts: No   Sensorium  Memory:No data recorded Judgment: Poor  Insight: Fair   Chartered certified accountant: Poor  Attention Span: Poor  Recall:No data recorded Fund of  Knowledge: Good  Language: Good   Psychomotor Activity  Psychomotor Activity: Psychomotor Activity: Normal   Assets  Assets: Manufacturing systems engineer; Housing; Resilience   Sleep  Sleep: Sleep: Poor    Physical Exam: Physical Exam ROS Blood pressure (!) 133/59, pulse 83, temperature 98.2 F (36.8 C), resp. rate 12, height 5' 7 (1.702 m), weight 124.7 kg, SpO2 96%, unknown if currently breastfeeding. Body mass index is 43.07 kg/m.   Treatment Plan Summary: Daily contact with patient to assess and evaluate symptoms and progress in treatment  Hoy HERO Cleotilde, NP 12/05/2023, 1:17 PM

## 2023-12-05 NOTE — Plan of Care (Signed)
?  Problem: Education: ?Goal: Knowledge of [facility] Education information/materials will improve ?Outcome: Not Progressing ?Goal: Emotional status will improve ?Outcome: Not Progressing ?Goal: Mental status will improve ?Outcome: Not Progressing ?Goal: Verbalization of understanding the information provided will improve ?Outcome: Not Progressing ?  ?Problem: Activity: ?Goal: Interest or engagement in activities will improve ?Outcome: Not Progressing ?Goal: Sleeping patterns will improve ?Outcome: Not Progressing ?  ?Problem: Coping: ?Goal: Ability to verbalize frustrations and anger appropriately will improve ?Outcome: Not Progressing ?Goal: Ability to demonstrate self-control will improve ?Outcome: Not Progressing ?  ?Problem: Health Behavior/Discharge Planning: ?Goal: Identification of resources available to assist in meeting health care needs will improve ?Outcome: Not Progressing ?Goal: Compliance with treatment plan for underlying cause of condition will improve ?Outcome: Not Progressing ?  ?Problem: Physical Regulation: ?Goal: Ability to maintain clinical measurements within normal limits will improve ?Outcome: Not Progressing ?  ?Problem: Safety: ?Goal: Periods of time without injury will increase ?Outcome: Not Progressing ?  ?Problem: Education: ?Goal: Utilization of techniques to improve thought processes will improve ?Outcome: Not Progressing ?Goal: Knowledge of the prescribed therapeutic regimen will improve ?Outcome: Not Progressing ?  ?Problem: Activity: ?Goal: Interest or engagement in leisure activities will improve ?Outcome: Not Progressing ?Goal: Imbalance in normal sleep/wake cycle will improve ?Outcome: Not Progressing ?  ?Problem: Coping: ?Goal: Coping ability will improve ?Outcome: Not Progressing ?Goal: Will verbalize feelings ?Outcome: Not Progressing ?  ?Problem: Health Behavior/Discharge Planning: ?Goal: Ability to make decisions will improve ?Outcome: Not Progressing ?Goal:  Compliance with therapeutic regimen will improve ?Outcome: Not Progressing ?  ?Problem: Role Relationship: ?Goal: Will demonstrate positive changes in social behaviors and relationships ?Outcome: Not Progressing ?  ?Problem: Safety: ?Goal: Ability to disclose and discuss suicidal ideas will improve ?Outcome: Not Progressing ?Goal: Ability to identify and utilize support systems that promote safety will improve ?Outcome: Not Progressing ?  ?Problem: Self-Concept: ?Goal: Will verbalize positive feelings about self ?Outcome: Not Progressing ?Goal: Level of anxiety will decrease ?Outcome: Not Progressing ?  ?

## 2023-12-05 NOTE — Group Note (Signed)
 Recreation Therapy Group Note   Group Topic:Coping Skills  Group Date: 12/05/2023 Start Time: 1005 End Time: 1045 Facilitators: Celestia Jeoffrey BRAVO, LRT, CTRS Location: Courtyard  Group Description: [employer]. LRT and patients played games of basketball, drew with chalk, and played corn hole while outside in the courtyard while getting fresh air and sunlight. Music was being played in the background. LRT and peers conversed about different games they have played before, what they do in their free time and anything else that is on their minds. LRT encouraged pts to drink water after being outside, sweating and getting their heart rate up.  Goal Area(s) Addressed: Patient will build on frustration tolerance skills. Patients will partake in a competitive play game with peers. Patients will gain knowledge of new leisure interest/hobby.    Affect/Mood: N/A   Participation Level: Did not attend    Clinical Observations/Individualized Feedback: Patient did not attend group.   Plan: Continue to engage patient in RT group sessions 2-3x/week.   [address] LRT, CTRS 12/05/2023 1:21 PM

## 2023-12-05 NOTE — Progress Notes (Signed)
   12/05/23 1100  Psych Admission Type (Psych Patients Only)  Admission Status Involuntary  Psychosocial Assessment  Patient Complaints Anxiety;Depression;Sleep disturbance (patient states that she has dealt with depression her whole life, it never really goes away, but it's not bad right now.)  Eye Contact Fair  Facial Expression Pained  Affect Depressed;Preoccupied (patient preoccupied with neck/back pain and not getting her muscle relaxers.)  Speech Logical/coherent;Soft  Interaction Isolative (patient isolative to room, except for meals.)  Motor Activity Slow  Appearance/Hygiene Unremarkable  Behavior Characteristics Cooperative;Appropriate to situation  Mood Depressed;Pleasant  Aggressive Behavior  Effect No apparent injury  Thought Process  Coherency WDL  Content WDL;Blaming others (patient blaming the provider for not putting her back on her muscle relaxers.)  Delusions None reported or observed  Perception WDL  Hallucination None reported or observed  Judgment WDL  Confusion None  Danger to Self  Current suicidal ideation? Denies  Self-Injurious Behavior No self-injurious ideation or behavior indicators observed or expressed   Agreement Not to Harm Self Yes  Description of Agreement Verbal  Danger to Others  Danger to Others None reported or observed   Patient stated I don't feel good today. Patient reports that her night time medication kept her up last night and that she also had nightmares, in which patient feels as if she's not receiving her medication for that. Patient's goal for today, per her self-inventory is to be truthful about what I'm feeling and what steps to take to feel better, in which she will talk in order to achieve her goal.

## 2023-12-05 NOTE — Group Note (Signed)
 Date:  12/05/2023 Time:  2:52 PM  Group Topic/Focus:  Goals Group:   The focus of this group is to help patients establish daily goals to achieve during treatment and discuss how the patient can incorporate goal setting into their daily lives to aide in recovery.    Participation Level:  Active  Participation Quality:  Appropriate  Affect:  Appropriate  Cognitive:  Appropriate  Insight: Appropriate  Engagement in Group:  Engaged  Modes of Intervention:  Activity  Additional Comments:    Candice Holder 12/05/2023, 2:52 PM

## 2023-12-05 NOTE — Progress Notes (Signed)
   12/04/23 2330  Psych Admission Type (Psych Patients Only)  Admission Status Involuntary  Psychosocial Assessment  Patient Complaints Anxiety  Eye Contact Brief  Facial Expression Grimacing  Affect Depressed  Speech Logical/coherent  Interaction Isolative  Motor Activity Slow  Appearance/Hygiene Unremarkable  Behavior Characteristics Appropriate to situation;Cooperative  Mood Sad;Depressed  Aggressive Behavior  Targets Self  Effect No apparent injury  Thought Process  Coherency WDL  Content WDL  Delusions None reported or observed  Perception WDL  Hallucination None reported or observed  Judgment WDL  Confusion WDL  Danger to Self  Current suicidal ideation? Passive  Self-Injurious Behavior No self-injurious ideation or behavior indicators observed or expressed   Agreement Not to Harm Self Yes  Description of Agreement Verbal  Danger to Others  Danger to Others None reported or observed

## 2023-12-05 NOTE — Group Note (Signed)
 LCSW Group Therapy Note  Group Date: 12/05/2023 Start Time: 1300 End Time: 1400   Type of Therapy and Topic:  Group Therapy: Anger Cues and Responses  Participation Level:  Did Not Attend   Description of Group:   In this group, patients learned how to recognize the physical, cognitive, emotional, and behavioral responses they have to anger-provoking situations.  They identified a recent time they became angry and how they reacted.  They analyzed how their reaction was possibly beneficial and how it was possibly unhelpful.  The group discussed a variety of healthier coping skills that could help with such a situation in the future.  Focus was placed on how helpful it is to recognize the underlying emotions to our anger, because working on those can lead to a more permanent solution as well as our ability to focus on the important rather than the urgent.  Therapeutic Goals: Patients will remember their last incident of anger and how they felt emotionally and physically, what their thoughts were at the time, and how they behaved. Patients will identify how their behavior at that time worked for them, as well as how it worked against them. Patients will explore possible new behaviors to use in future anger situations. Patients will learn that anger itself is normal and cannot be eliminated, and that healthier reactions can assist with resolving conflict rather than worsening situations.  Summary of Patient Progress:   Patient declined to attend group.   Therapeutic Modalities:   Cognitive Behavioral Therapy    Sherryle JINNY Margo, LCSW 12/05/2023  4:05 PM

## 2023-12-05 NOTE — Plan of Care (Signed)
  Problem: Education: Goal: Emotional status will improve Outcome: Initial Goal: Verbalization of understanding the information provided will improve Outcome: Progressing

## 2023-12-05 NOTE — Group Note (Signed)
 Date:  12/05/2023 Time:  2:26 PM  Group Topic/Focus:  Healthy Communication:   The focus of this group is to discuss communication, barriers to communication, as well as healthy ways to communicate with others.    Participation Level:    Participation Quality:    Affect:    Cognitive:    Insight:   Engagement in Group:    Modes of Intervention:    Additional Comments:    Elverda Wendel 12/05/2023, 2:26 PM

## 2023-12-06 MED ORDER — ATENOLOL 25 MG PO TABS
25.0000 mg | ORAL_TABLET | Freq: Every day | ORAL | Status: DC
  Administered 2023-12-06 – 2023-12-08 (×3): 25 mg via ORAL
  Filled 2023-12-06 (×3): qty 1

## 2023-12-06 NOTE — Progress Notes (Signed)
 [facility] MD Progress Note  12/06/2023 1:27 PM Candice Holder  MRN:  [id number] Subjective:  Candice Holder is a 37yo female who presents to [facility] per [facility] petition following intentional overdose attempt.   Patient seen today for follow-up psychiatric evaluation.   Patient seen today for follow-up psychiatric evaluation. Upon approach, patient is noted to be in bed but more responsive than prior days. When engaged, she states: "I'm better, I'm just tired," and adds, "I do feel better."  She reports that sleep remains an issue, and we discussed strategies to support daytime wakefulness and increased activity. She reports depression has slightly improved (6/10) and anxiety is also improved (4/10). She reports that the recent addition of gabapentin has helped with chronic pain.  Patient reports progress in reconnecting with family, noting that her sisters are planning to help her apply for jobs. She denies any medication side effects.  Orders placed today include hemoglobin A1C, lipid panel, and a baseline EKG. Current psychotropic regimen of Caplyta 42 mg is continued without changes.  Denies medication side effects and there are none noted. Medication education provided to include risks, benefits, and side effects. Patient remains appropriate for the inpatient psychiatric setting for safety, medication management, and symptom stabilization.   Psychiatric Plan and Interventions:  Continue Caplyta 42 mg PO daily for mood stabilization  Continue Prazosin 2 mg PO nightly for trauma-related nightmares  Continue to utilize PRN Trazodone for sleep as needed  Monitor response to gabapentin, reported as helpful for chronic pain  Support increased daytime activity and behavioral activation  Encourage family involvement and job-seeking efforts  No medication side effects noted  Denies suicidal ideation, homicidal ideation, hallucinations, paranoia, or delusions  Medication  education provided to include risks, benefits, and side effects  Patient verbalized understanding and agrees to plan of care  Patient remains appropriate for the inpatient psychiatric setting for safety, medication management, and symptom stabilization  Medical Plan and Interventions:  Order and monitor hemoglobin A1C, lipid panel, and baseline EKG  Monitor for ongoing sleep disturbances and metabolic concerns    Principal Problem: Bipolar depression (HCC) Diagnosis: Principal Problem:   Bipolar depression (HCC)  Total Time spent with patient: 30 minutes  Past Psychiatric History:  - Past suicide attempts reported, including one overdose as a teenager - Long-standing mental health issues - History of multiple medication trials: highest dose Prozac, Wellbutrin, Seroquel, trazodone - Currently taking medications but unclear on full regimen - Expresses desire to "find what works"   Past Medical History:  Past Medical History:  Diagnosis Date   Anxiety    Depression    Hypertension    Obesity    VTE (venous thromboembolism)     Past Surgical History:  Procedure Laterality Date   CESAREAN SECTION     X 2   Family History:  Family History  Problem Relation Age of Onset   Arthritis Mother    COPD Mother    Diabetes Mother    Hypertension Mother    Heart disease Mother    Cerebral aneurysm Mother    Arthritis Maternal Aunt    Ovarian cancer Maternal Aunt    COPD Maternal Aunt    Diabetes Maternal Aunt    Hypertension Maternal Aunt    Arthritis Maternal Grandmother    Diabetes Maternal Grandmother    Hypertension Brother    CVA Brother    Hypertension Brother    Hypertension Half-Brother    Hypertension Half-Brother    Hypertension Sister  Hypertension Sister    Hypertension Sister    Hypertension Sister    Renal Disease Sister    Hypertension Half-Sister    Hypertension Half-Sister    Crohn's disease Half-Sister    Irritable bowel syndrome Half-Sister     Hypertension Half-Sister     Social History:  Social History   Substance and Sexual Activity  Alcohol Use Not Currently   Comment: social     Social History   Substance and Sexual Activity  Drug Use No    Social History   Socioeconomic History   Marital status: Single    Spouse name: Not on file   Number of children: 2   Years of education: GED + CURRENTLY ENROLLED IN COLLEGE   Highest education level: Not on file  Occupational History   Occupation: UNEMPLOYED AS OF DOS  Tobacco Use   Smoking status: Never   Smokeless tobacco: Never  Vaping Use   Vaping status: Never Used  Substance and Sexual Activity   Alcohol use: Not Currently    Comment: social   Drug use: No   Sexual activity: Not Currently  Other Topics Concern   Not on file  Social History Narrative   Not on file   Social Drivers of Health   Financial Resource Strain: Not on file  Food Insecurity: Food Insecurity Present (12/04/2023)   Hunger Vital Sign    Worried About Running Out of Food in the Last Year: Sometimes true    Ran Out of Food in the Last Year: Sometimes true  Transportation Needs: Unmet Transportation Needs (12/04/2023)   PRAPARE - Administrator, Civil Service (Medical): Yes    Lack of Transportation (Non-Medical): Yes  Physical Activity: Not on file  Stress: Not on file  Social Connections: Not on file                          Sleep: Poor Estimated Sleeping Duration (Last 24 Hours): 8.25-11.00 hours  Appetite:  Fair  Current Medications: Current Facility-Administered Medications  Medication Dose Route Frequency Provider Last Rate Last Admin   acetaminophen  (TYLENOL ) tablet 650 mg  650 mg Oral Q6H PRN Bobbitt, Shalon E, NP   650 mg at 12/04/23 1059   alum & mag hydroxide-simeth (MAALOX/MYLANTA) 200-200-20 MG/5ML suspension 30 mL  30 mL Oral Q4H PRN Bobbitt, Shalon E, NP       haloperidol (HALDOL) tablet 5 mg  5 mg Oral TID PRN Bobbitt, Shalon E, NP        And   diphenhydrAMINE (BENADRYL) capsule 50 mg  50 mg Oral TID PRN Bobbitt, Shalon E, NP   50 mg at 12/05/23 2114   haloperidol lactate (HALDOL) injection 5 mg  5 mg Intramuscular TID PRN Bobbitt, Shalon E, NP       And   diphenhydrAMINE (BENADRYL) injection 50 mg  50 mg Intramuscular TID PRN Bobbitt, Shalon E, NP       And   LORazepam (ATIVAN) injection 2 mg  2 mg Intramuscular TID PRN Bobbitt, Shalon E, NP       haloperidol lactate (HALDOL) injection 10 mg  10 mg Intramuscular TID PRN Bobbitt, Shalon E, NP       And   diphenhydrAMINE (BENADRYL) injection 50 mg  50 mg Intramuscular TID PRN Bobbitt, Shalon E, NP       And   LORazepam (ATIVAN) injection 2 mg  2 mg Intramuscular TID PRN Bobbitt, Shalon E, NP  gabapentin (NEURONTIN) capsule 300 mg  300 mg Oral TID Cleotilde Hoy HERO, NP   300 mg at 12/06/23 1239   hydrOXYzine (ATARAX) tablet 25 mg  25 mg Oral TID PRN Bobbitt, Shalon E, NP   25 mg at 12/04/23 2114   lumateperone tosylate (CAPLYTA) capsule 42 mg  42 mg Oral QHS Cleotilde Hoy HERO, NP   42 mg at 12/05/23 2111   magnesium hydroxide (MILK OF MAGNESIA) suspension 30 mL  30 mL Oral Daily PRN Bobbitt, Shalon E, NP       prazosin (MINIPRESS) capsule 2 mg  2 mg Oral QHS Cleotilde Hoy HERO, NP   2 mg at 12/05/23 2111   traZODone (DESYREL) tablet 50 mg  50 mg Oral QHS PRN Bobbitt, Shalon E, NP   50 mg at 12/05/23 2115    Lab Results: No results found for this or any previous visit (from the past 48 hours).  Blood Alcohol level:  Lab Results  Component Value Date   ETH 265 (H) 11/12/2017      Musculoskeletal: Strength & Muscle Tone: within normal limits Gait & Station: normal   Psychiatric Specialty Exam:   Appearance: Slightly disheveled  Behavior: Cooperative, responsive  Speech: Normal rate and tone  Mood: "Better," mildly depressed  Affect: Flat but appropriate  Thought Process: Linear, goal-directed  Thought Content: No delusions or  psychosis  Perception: Denies hallucinations  Cognition: Alert and oriented x3  Insight: Fair  Judgment: Fair     Assets  Assets: Manufacturing systems engineer; Housing; Resilience   Sleep  Sleep: No data recorded    Physical Exam: Physical Exam ROS Blood pressure 135/60, pulse 88, temperature 98.2 F (36.8 C), resp. rate 12, height 5' 7 (1.702 m), weight 124.7 kg, SpO2 98%, unknown if currently breastfeeding. Body mass index is 43.07 kg/m.   Treatment Plan Summary: Daily contact with patient to assess and evaluate symptoms and progress in treatment  Hoy HERO Cleotilde, NP 12/06/2023, 1:27 PM

## 2023-12-06 NOTE — [facility] Suicide Risk Assessment (Signed)
 [facility] INPATIENT:  Family/Significant Other Suicide Prevention Education  Suicide Prevention Education:  Education Completed; Monigue Spraggins, sister, [phone] has been identified by the patient as the family member/significant other with whom the patient will be residing, and identified as the person(s) who will aid the patient in the event of a mental health crisis (suicidal ideations/suicide attempt).  With written consent from the patient, the family member/significant other has been provided the following suicide prevention education, prior to the and/or following the discharge of the patient.  The suicide prevention education provided includes the following: Suicide risk factors Suicide prevention and interventions National Suicide Hotline telephone number [facility] assessment telephone number [facility] Emergency Assistance 911 [facility] and/or Residential Mobile Crisis Unit telephone number  Request made of family/significant other to: Remove weapons (e.g., guns, rifles, knives), all items previously/currently identified as safety concern.   Remove drugs/medications (over-the-counter, prescriptions, illicit drugs), all items previously/currently identified as a safety concern.  The family member/significant other verbalizes understanding of the suicide prevention education information provided.  The family member/significant other agrees to remove the items of safety concern listed above.  Sherryle JINNY Margo 12/06/2023, 9:40 AM

## 2023-12-06 NOTE — Progress Notes (Signed)
   12/06/23 1400  Psych Admission Type (Psych Patients Only)  Admission Status Involuntary  Psychosocial Assessment  Patient Complaints Depression  Eye Contact Fair  Facial Expression Flat  Affect Depressed  Speech Logical/coherent  Interaction Isolative  Motor Activity Slow  Appearance/Hygiene Unremarkable  Behavior Characteristics Cooperative  Mood Depressed  Aggressive Behavior  Effect No apparent injury  Thought Process  Coherency WDL  Content WDL  Delusions None reported or observed  Perception WDL  Hallucination None reported or observed  Judgment WDL  Confusion None  Danger to Self  Current suicidal ideation? Denies  Self-Injurious Behavior No self-injurious ideation or behavior indicators observed or expressed   Agreement Not to Harm Self Yes  Description of Agreement Verbal  Danger to Others  Danger to Others None reported or observed   Anely was more present in the milieu today. EKG obtained.

## 2023-12-06 NOTE — Progress Notes (Signed)
 Pt calm and pleasant during assessment denying SI/HI/AVH. Pt endorsees anxiety and depression. Pt observed by this Clinical research associate interacting appropriately with staff and peers on the unit. Pt compliant with medication administration per MD orders. Pt given education, support, and encouragement to be active in her treatment plan. Pt being monitored Q 15 minutes for safety per unit protocol, remains safe on the unit

## 2023-12-06 NOTE — Group Note (Signed)
 [facility] LCSW Group Therapy Note   Group Date: 12/06/2023 Start Time: 1300 End Time: 1400   Type of Therapy/Topic:  Group Therapy:  Emotion Regulation  Participation Level:  Active   Mood:  Description of Group:    The purpose of this group is to assist patients in learning to regulate negative emotions and experience positive emotions. Patients will be guided to discuss ways in which they have been vulnerable to their negative emotions. These vulnerabilities will be juxtaposed with experiences of positive emotions or situations, and patients challenged to use positive emotions to combat negative ones. Special emphasis will be placed on coping with negative emotions in conflict situations, and patients will process healthy conflict resolution skills.  Therapeutic Goals: Patient will identify two positive emotions or experiences to reflect on in order to balance out negative emotions:  Patient will label two or more emotions that they find the most difficult to experience:  Patient will be able to demonstrate positive conflict resolution skills through discussion or role plays:   Summary of Patient Progress:   The patient actively participated in group, openly sharing personal experiences related to managing complex triggers. Together with peers and the group facilitator, the patient engaged in brainstorming coping strategies addressing these triggers at both micro and macro levels. The group collaboratively explored various life domains--such as family, identity, career, religion, and social supports--identifying whether these areas placed them in the blue, green, yellow, or red emotional zones. For domains identified in the yellow or red zones, the group held in-depth discussions to recognize specific triggers and explore strategies for healthier coping and improved well-being.    Therapeutic Modalities:   Cognitive Behavioral Therapy Feelings Identification Dialectical Behavioral  Therapy   Alveta CHRISTELLA Kerns, LCSW

## 2023-12-06 NOTE — Plan of Care (Signed)
   Problem: Education: Goal: Emotional status will improve Outcome: Progressing Goal: Mental status will improve Outcome: Progressing

## 2023-12-06 NOTE — Group Note (Signed)
 Date:  12/06/2023 Time:  12:57 PM  Group Topic/Focus:  Dimensions of Wellness:   The focus of this group is to introduce the topic of wellness and discuss the role each dimension of wellness plays in total health.    Participation Level:  Did Not Attend   Camellia HERO Zianne Schubring 12/06/2023, 12:57 PM

## 2023-12-06 NOTE — [facility] Counselor (Signed)
 CSW spoke with the patient's sister, Sanja Elizardo, [phone].  Sister reports that patient was admitted because she needed a peace of mind.  She reports that the patient often thinks that she is a failure but she is not, no one thinks of her that way.   She reports that patient is most likely a danger to her self.  She reports that the patient is not a danger to others and has not been since we were kids.   She reports that she is unaware of any weapons beyond knives in the home.     She reports that she has been able to speak with the patient and feels patient has been doing much better.    She reports that the patient being isolative to herself and her room is normal.  She reports that this is common amongst their family.    She reports that patient does much better on a personal basis.  Sherryle Margo, MSW, LCSW 12/06/2023 9:48 AM

## 2023-12-06 NOTE — Group Note (Signed)
 Date:  12/06/2023 Time:  10:39 AM  Group Topic/Focus:  Building Self Esteem:   The Focus of this group is helping patients become aware of the effects of self-esteem on their lives, the things they and others do that enhance or undermine their self-esteem, seeing the relationship between their level of self-esteem and the choices they make and learning ways to enhance self-esteem.    Participation Level:  Did Not Attend   Candice Holder 12/06/2023, 10:39 AM

## 2023-12-06 NOTE — Plan of Care (Signed)

## 2023-12-07 LAB — VITAMIN D 25 HYDROXY (VIT D DEFICIENCY, FRACTURES): Vit D, 25-Hydroxy: 19.15 ng/mL — ABNORMAL LOW (ref 30–100)

## 2023-12-07 LAB — LIPID PANEL
Cholesterol: 140 mg/dL (ref 0–200)
HDL: 49 mg/dL (ref >40)
LDL Cholesterol: 68 mg/dL (ref 0–99)
Total CHOL/HDL Ratio: 2.9 ratio
Triglycerides: 113 mg/dL (ref <150)
VLDL: 23 mg/dL (ref 0–40)

## 2023-12-07 LAB — HEMOGLOBIN A1C
Hgb A1c MFr Bld: 5.3 % (ref 4.8–5.6)
Mean Plasma Glucose: 105.41 mg/dL

## 2023-12-07 MED ORDER — PRAZOSIN HCL 2 MG PO CAPS: 2.0000 mg | ORAL_CAPSULE | Freq: Every day | ORAL | 0 refills | Status: AC

## 2023-12-07 MED ORDER — LUMATEPERONE TOSYLATE 42 MG PO CAPS: 42.0000 mg | ORAL_CAPSULE | Freq: Every day | ORAL | 0 refills | Status: AC

## 2023-12-07 MED ORDER — GABAPENTIN 300 MG PO CAPS: 300.0000 mg | ORAL_CAPSULE | Freq: Three times a day (TID) | ORAL | 0 refills | Status: DC

## 2023-12-07 MED ORDER — ONDANSETRON 4 MG PO TBDP
4.0000 mg | ORAL_TABLET | Freq: Three times a day (TID) | ORAL | Status: DC | PRN
  Administered 2023-12-07: 4 mg via ORAL
  Filled 2023-12-07: qty 1

## 2023-12-07 NOTE — Plan of Care (Signed)

## 2023-12-07 NOTE — Group Note (Signed)
 Date:  12/07/2023 Time:  12:29 AM  Group Topic/Focus:  Overcoming Stress:   The focus of this group is to define stress and help patients assess their triggers.    Participation Level:  Active  Participation Quality:  Appropriate and Attentive  Affect:  Appropriate  Cognitive:  Alert and Appropriate  Insight: Appropriate and Good  Engagement in Group:  Developing/Improving and Engaged  Modes of Intervention:  Clarification, Discussion, Rapport Building, and Support  Additional Comments:     Jeovanny Cuadros 12/07/2023, 12:29 AM

## 2023-12-07 NOTE — Group Note (Signed)
 [facility] LCSW Group Therapy Note   Group Date: 12/07/2023 Start Time: 1300 End Time: 1350   Type of Therapy/Topic:  Group Therapy:  Balance in Life  Participation Level:  Active   Description of Group:    This group will address the concept of balance and how it feels and looks when one is unbalanced. Patients will be encouraged to process areas in their lives that are out of balance, and identify reasons for remaining unbalanced. Facilitators will guide patients utilizing problem- solving interventions to address and correct the stressor making their life unbalanced. Understanding and applying boundaries will be explored and addressed for obtaining  and maintaining a balanced life. Patients will be encouraged to explore ways to assertively make their unbalanced needs known to significant others in their lives, using other group members and facilitator for support and feedback.  Therapeutic Goals: Patient will identify two or more emotions or situations they have that consume much of in their lives. Patient will identify signs/triggers that life has become out of balance:  Patient will identify two ways to set boundaries in order to achieve balance in their lives:  Patient will demonstrate ability to communicate their needs through discussion and/or role plays  Summary of Patient Progress: Patient was present for the entirety of the group process. She was actively engaged in the discussion. Pt shared that she finds balance in her life when things are taken care of like her bills and children. She participated in reading the 7 Steps to finding balance. Pt shared that her experience growing up has made her very quiet and closed off to not only bad experiences but also good ones. She appeared to have insight into herself and the topic. Pt appeared open and receptive to feedback/comments from both her peers and facilitator.    Therapeutic Modalities:   Cognitive Behavioral Therapy Solution-Focused  Therapy Assertiveness Training   Nadara JONELLE Fam, LCSW

## 2023-12-07 NOTE — Group Note (Signed)
 Date:  12/07/2023 Time:  9:21 PM  Group Topic/Focus:  Coping With Mental Health Crisis:   The purpose of this group is to help patients identify strategies for coping with mental health crisis.  Group discusses possible causes of crisis and ways to manage them effectively. Identifying Needs:   The focus of this group is to help patients identify their personal needs that have been historically problematic and identify healthy behaviors to address their needs.    Participation Level:  Active  Participation Quality:  Appropriate  Affect:  Appropriate  Cognitive:  Appropriate  Insight: Appropriate  Engagement in Group:  Engaged  Modes of Intervention:  Discussion  Additional Comments:  n/a  Kace Hartje L 12/07/2023, 9:21 PM

## 2023-12-07 NOTE — Group Note (Signed)
 Date:  12/07/2023 Time:  5:42 PM  Group Topic/Focus:  Goals Group:   The focus of this group is to help patients establish daily goals to achieve during treatment and discuss how the patient can incorporate goal setting into their daily lives to aide in recovery.    Participation Level:  Did Not Attend   Deitra Clap [facility] 12/07/2023, 5:42 PM

## 2023-12-07 NOTE — Progress Notes (Signed)
 Pt calm and pleasant during assessment denying SI/HI/AVH. Pt endorsees anxiety and depression. Pt observed by this Clinical research associate interacting appropriately with staff and peers on the unit. Pt compliant with medication administration per MD orders. Pt given education, support, and encouragement to be active in her treatment plan. Pt being monitored Q 15 minutes for safety per unit protocol, remains safe on the unit

## 2023-12-07 NOTE — Progress Notes (Signed)
   12/07/23 1300  Psych Admission Type (Psych Patients Only)  Admission Status Involuntary  Psychosocial Assessment  Patient Complaints Depression  Eye Contact Fair  Facial Expression Flat  Affect Flat  Speech Logical/coherent  Interaction Isolative  Motor Activity Slow  Appearance/Hygiene Unremarkable  Behavior Characteristics Cooperative  Mood Depressed  Aggressive Behavior  Effect No apparent injury  Thought Process  Coherency WDL  Content WDL  Delusions None reported or observed  Perception WDL  Hallucination None reported or observed  Judgment WDL  Confusion None  Danger to Self  Current suicidal ideation? Denies  Agreement Not to Harm Self Yes  Description of Agreement Verbals  Danger to Others  Danger to Others None reported or observed   Candice Holder continues to socially engage in the milieu. Reports nausea that is managed through Zofran. Blood pressure has decreased with Atenolol use.

## 2023-12-07 NOTE — Progress Notes (Signed)
 [facility] MD Progress Note  12/07/2023 4:14 PM Candice Holder  MRN:  [id number] Subjective:  Candice Holder is a 38yo female who presents to [facility] per [facility] petition following intentional overdose attempt.   Patient seen today for follow-up psychiatric evaluation.  Upon approach, patient is noted to be in the hall, engaged and brighter with staff and peers. Presentation is calm and cooperative.  Patient reports mood is I feel good, hallucinations denied, paranoia denied, delusions denied, anxiety improved, suicidal ideation denied, homicidal ideation denied. Sleep is improved, appetite stable, energy better. Reports overall outlook is improved and she feels safe. Encouraged that her sisters are helping her with a plan regarding her son. She states, I've come to realize I have to handle things one by one.   Objective / Mental Status Exam: Appearance:  Groomed and appropriate Behavior: Calm, cooperative Speech: Normal rate, rhythm, and volume Mood: Calm, improved Affect: congruent Thought Process: Organized, linear Thought Content: Logical, no psychosis Perception: No hallucinations Insight: improved Judgment: fair Cognition: Alert and oriented x3 Eye Contact: Good Attention: good Psychomotor: Normal Suicidal Thoughts: Denied Homicidal Thoughts: Denied Orientation: Full (time, place, person)  Patient presentation is consistent with Bipolar Depression without psychotic features, PTSD, and Alcohol Use Disorder. Patient remains appropriate for continued inpatient care due to safety concerns in the context of previous suicide attempt, mood instability, and anxiety. There is noted improvement in insight, mood, and motivation. Denies medication side effects and there are none noted.  Working diagnosis:  Bipolar Depression, PTSD, Alcohol Use Disorder  Protective factors include children and stated goal of returning to school.  Psychiatric Plan and  Interventions:  Continue Caplyta 42 mg PO daily for mood stabilization  Continue Prazosin 2 mg PO QHS for trauma-related nightmares  Continue to utilize PRN Trazodone for sleep as needed  Monitor response to gabapentin, reported helpful for chronic pain  Support increased daytime activity and behavioral activation  Encourage family involvement and job-seeking efforts  Evaluate for trauma-focused therapy, including EMDR  Medication education provided to include risks, benefits, and side effects  Patient verbalized understanding and agrees to plan of care. Likely discharge to home tomorrow with follow up and family supports   Medical Plan and Interventions:  Continue to monitor sleep and metabolic indicators  Labs: Hemoglobin A1C, lipid panel, EKG ordered  Disposition:Patient remains appropriate for the inpatient psychiatric setting for safety, medication management, and symptom stabilization.    Principal Problem: Bipolar depression (HCC) Diagnosis: Principal Problem:   Bipolar depression (HCC)  Total Time spent with patient: 30 minutes  Past Psychiatric History:  - Past suicide attempts reported, including one overdose as a teenager - Long-standing mental health issues - History of multiple medication trials: highest dose Prozac, Wellbutrin, Seroquel, trazodone - Currently taking medications but unclear on full regimen - Expresses desire to "find what works"   Past Medical History:  Past Medical History:  Diagnosis Date   Anxiety    Depression    Hypertension    Obesity    VTE (venous thromboembolism)     Past Surgical History:  Procedure Laterality Date   CESAREAN SECTION     X 2   Family History:  Family History  Problem Relation Age of Onset   Arthritis Mother    COPD Mother    Diabetes Mother    Hypertension Mother    Heart disease Mother    Cerebral aneurysm Mother    Arthritis Maternal Aunt    Ovarian cancer Maternal Aunt  COPD Maternal Aunt     Diabetes Maternal Aunt    Hypertension Maternal Aunt    Arthritis Maternal Grandmother    Diabetes Maternal Grandmother    Hypertension Brother    CVA Brother    Hypertension Brother    Hypertension Half-Brother    Hypertension Half-Brother    Hypertension Sister    Hypertension Sister    Hypertension Sister    Hypertension Sister    Renal Disease Sister    Hypertension Half-Sister    Hypertension Half-Sister    Crohn's disease Half-Sister    Irritable bowel syndrome Half-Sister    Hypertension Half-Sister     Social History:  Social History   Substance and Sexual Activity  Alcohol Use Not Currently   Comment: social     Social History   Substance and Sexual Activity  Drug Use No    Social History   Socioeconomic History   Marital status: Single    Spouse name: Not on file   Number of children: 2   Years of education: GED + CURRENTLY ENROLLED IN COLLEGE   Highest education level: Not on file  Occupational History   Occupation: UNEMPLOYED AS OF DOS  Tobacco Use   Smoking status: Never   Smokeless tobacco: Never  Vaping Use   Vaping status: Never Used  Substance and Sexual Activity   Alcohol use: Not Currently    Comment: social   Drug use: No   Sexual activity: Not Currently  Other Topics Concern   Not on file  Social History Narrative   Not on file   Social Drivers of Health   Financial Resource Strain: Not on file  Food Insecurity: Food Insecurity Present (12/04/2023)   Hunger Vital Sign    Worried About Running Out of Food in the Last Year: Sometimes true    Ran Out of Food in the Last Year: Sometimes true  Transportation Needs: Unmet Transportation Needs (12/04/2023)   PRAPARE - Administrator, Civil Service (Medical): Yes    Lack of Transportation (Non-Medical): Yes  Physical Activity: Not on file  Stress: Not on file  Social Connections: Not on file                          Sleep: Poor Estimated Sleeping  Duration (Last 24 Hours): 8.50-10.75 hours  Appetite:  Fair  Current Medications: Current Facility-Administered Medications  Medication Dose Route Frequency Provider Last Rate Last Admin   acetaminophen  (TYLENOL ) tablet 650 mg  650 mg Oral Q6H PRN Bobbitt, Shalon E, NP   650 mg at 12/04/23 1059   alum & mag hydroxide-simeth (MAALOX/MYLANTA) 200-200-20 MG/5ML suspension 30 mL  30 mL Oral Q4H PRN Bobbitt, Shalon E, NP       atenolol (TENORMIN) tablet 25 mg  25 mg Oral Daily Cleotilde Hoy HERO, NP   25 mg at 12/07/23 1010   haloperidol (HALDOL) tablet 5 mg  5 mg Oral TID PRN Bobbitt, Shalon E, NP       And   diphenhydrAMINE (BENADRYL) capsule 50 mg  50 mg Oral TID PRN Bobbitt, Shalon E, NP   50 mg at 12/06/23 2048   haloperidol lactate (HALDOL) injection 5 mg  5 mg Intramuscular TID PRN Bobbitt, Shalon E, NP       And   diphenhydrAMINE (BENADRYL) injection 50 mg  50 mg Intramuscular TID PRN Bobbitt, Shalon E, NP       And   LORazepam (ATIVAN)  injection 2 mg  2 mg Intramuscular TID PRN Bobbitt, Shalon E, NP       haloperidol lactate (HALDOL) injection 10 mg  10 mg Intramuscular TID PRN Bobbitt, Shalon E, NP       And   diphenhydrAMINE (BENADRYL) injection 50 mg  50 mg Intramuscular TID PRN Bobbitt, Shalon E, NP       And   LORazepam (ATIVAN) injection 2 mg  2 mg Intramuscular TID PRN Bobbitt, Shalon E, NP       gabapentin (NEURONTIN) capsule 300 mg  300 mg Oral TID Cleotilde Hoy HERO, NP   300 mg at 12/07/23 1213   hydrOXYzine (ATARAX) tablet 25 mg  25 mg Oral TID PRN Bobbitt, Shalon E, NP   25 mg at 12/04/23 2114   lumateperone tosylate (CAPLYTA) capsule 42 mg  42 mg Oral QHS Cleotilde Hoy HERO, NP   42 mg at 12/06/23 2048   magnesium hydroxide (MILK OF MAGNESIA) suspension 30 mL  30 mL Oral Daily PRN Bobbitt, Shalon E, NP       ondansetron (ZOFRAN-ODT) disintegrating tablet 4 mg  4 mg Oral Q8H PRN Cleotilde Hoy HERO, NP       prazosin (MINIPRESS) capsule 2 mg  2 mg Oral QHS Cleotilde Hoy HERO, NP   2 mg at 12/06/23 2048   traZODone (DESYREL) tablet 50 mg  50 mg Oral QHS PRN Bobbitt, Shalon E, NP   50 mg at 12/06/23 2048    Lab Results:  Results for orders placed or performed during the hospital encounter of 12/03/23 (from the past 48 hours)  Hemoglobin A1c     Status: None   Collection Time: 12/07/23  6:30 AM  Result Value Ref Range   Hgb A1c MFr Bld 5.3 4.8 - 5.6 %    Comment: (NOTE) Diagnosis of Diabetes The following HbA1c ranges recommended by the American Diabetes Association (ADA) may be used as an aid in the diagnosis of diabetes mellitus.  Hemoglobin             Suggested A1C NGSP%              Diagnosis  <5.7                   Non Diabetic  5.7-6.4                Pre-Diabetic  >6.4                   Diabetic  <7.0                   Glycemic control for                       adults with diabetes.     Mean Plasma Glucose 105.41 mg/dL    Comment: Performed at [facility], [address], [place], [place] [address]  Lipid panel     Status: None   Collection Time: 12/07/23  6:30 AM  Result Value Ref Range   Cholesterol 140 0 - 200 mg/dL   Triglycerides 886 <849 mg/dL   HDL 49 >59 mg/dL   Total CHOL/HDL Ratio 2.9 RATIO   VLDL 23 0 - 40 mg/dL   LDL Cholesterol 68 0 - 99 mg/dL    Comment:        Total Cholesterol/HDL:CHD Risk Coronary Heart Disease Risk Table  Men   Women  1/2 Average Risk   3.4   3.3  Average Risk       5.0   4.4  2 X Average Risk   9.6   7.1  3 X Average Risk  23.4   11.0        Use the calculated Patient Ratio above and the CHD Risk Table to determine the patient's CHD Risk.        ATP III CLASSIFICATION (LDL):  <100     mg/dL   Optimal  899-870  mg/dL   Near or Above                    Optimal  130-159  mg/dL   Borderline  839-810  mg/dL   High  >809     mg/dL   Very High Performed at [facility], [address] [place], [place] [address]   VITAMIN D 25 Hydroxy (Vit-D Deficiency,  Fractures)     Status: Abnormal   Collection Time: 12/07/23  6:30 AM  Result Value Ref Range   Vit D, 25-Hydroxy 19.15 (L) 30 - 100 ng/mL    Comment: (NOTE) Vitamin D deficiency has been defined by the Institute of Medicine  and an Endocrine Society practice guideline as a level of serum 25-OH  vitamin D less than 20 ng/mL (1,2). The Endocrine Society went on to  further define vitamin D insufficiency as a level between 21 and 29  ng/mL (2).  1. IOM (Institute of Medicine). 2010. Dietary reference intakes for  calcium and D. [place]: [employer]. 2. Holick MF, Binkley [place], Bischoff-Ferrari HA, et al. Evaluation,  treatment, and prevention of vitamin D deficiency: an Endocrine  Society clinical practice guideline, JCEM. 2011 Jul; 96(7): 1911-30.  Performed at [facility], [address], [place], [place] [address]     Blood Alcohol level:  Lab Results  Component Value Date   ETH 265 (H) 11/12/2017      Musculoskeletal: Strength & Muscle Tone: within normal limits Gait & Station: normal      Assets  Assets: Manufacturing systems engineer; Housing; Resilience   Sleep  Sleep: No data recorded    Physical Exam: Physical Exam ROS Blood pressure (!) 141/90, pulse 74, temperature (!) 97.4 F (36.3 C), resp. rate 12, height 5' 7 (1.702 m), weight 124.7 kg, SpO2 96%, unknown if currently breastfeeding. Body mass index is 43.07 kg/m.   Treatment Plan Summary: Daily contact with patient to assess and evaluate symptoms and progress in treatment  Hoy CHRISTELLA Pinal, NP 12/07/2023, 4:14 PM

## 2023-12-07 NOTE — Group Note (Signed)
 Date:  12/07/2023 Time:  5:09 PM  Group Topic/Focus:  Wellness Toolbox:   The focus of this group is to discuss various aspects of wellness, balancing those aspects and exploring ways to increase the ability to experience wellness.  Patients will create a wellness toolbox for use upon discharge.    Participation Level:  Did Not Attend   Deitra Clap [facility] 12/07/2023, 5:09 PM

## 2023-12-07 NOTE — Plan of Care (Signed)
   Problem: Education: Goal: Emotional status will improve Outcome: Progressing Goal: Mental status will improve Outcome: Progressing

## 2023-12-08 MED ORDER — TRAZODONE HCL 50 MG PO TABS: 50.0000 mg | ORAL_TABLET | Freq: Every evening | ORAL | 0 refills | Status: AC | PRN

## 2023-12-08 NOTE — Progress Notes (Signed)
   12/08/23 0900  Psych Admission Type (Psych Patients Only)  Admission Status Voluntary  Psychosocial Assessment  Patient Complaints None  Eye Contact Fair  Facial Expression Flat  Affect Flat  Speech Incoherent  Interaction Minimal  Motor Activity Other (Comment) (appropriate)  Appearance/Hygiene Unremarkable  Behavior Characteristics Cooperative  Mood Depressed  Thought Process  Content WDL  Delusions None reported or observed  Perception WDL  Hallucination None reported or observed  Judgment WDL  Confusion None  Danger to Self  Current suicidal ideation? Denies  Agreement Not to Harm Self Yes  Description of Agreement verbal
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
## 2023-12-08 NOTE — Group Note (Signed)
 Date:  12/08/2023 Time:  11:32 AM  Group Topic/Focus:  Healthy Communication:   The focus of this group is to discuss communication, barriers to communication, as well as healthy ways to communicate with others.    Participation Level:  Did Not Attend   Candice Holder 12/08/2023, 11:32 AM

## 2023-12-08 NOTE — BHH Suicide Risk Assessment (Signed)
 [facility] Discharge Suicide Risk Assessment   Principal Problem: Bipolar depression (HCC) Discharge Diagnoses: Principal Problem:   Bipolar depression (HCC)   Total Time spent with patient: 30 minutes  Musculoskeletal: Strength & Muscle Tone: within normal limits Gait & Station: normal   Psychiatric Specialty Exam  Presentation  General Appearance:  Appropriate for Environment; Casual; Neat  Eye Contact: Good  Speech: Normal Rate  Speech Volume: Normal  Handedness:No data recorded  Mood and Affect  Mood: Euthymic  Duration of Depression Symptoms: No data recorded Affect: Appropriate   Thought Process  Thought Processes: Linear  Descriptions of Associations:Intact  Orientation:Full (Time, Place and Person)  Thought Content:Logical  History of Schizophrenia/Schizoaffective disorder:No data recorded Duration of Psychotic Symptoms:No data recorded Hallucinations:Hallucinations: None  Ideas of Reference:None  Suicidal Thoughts:Suicidal Thoughts: No  Homicidal Thoughts:Homicidal Thoughts: No   Sensorium  Memory:Immediate Good; Remote Good  Judgment: Good  Insight: Good   Executive Functions  Concentration: Good  Attention Span: Good  Recall:Good  Fund of Knowledge: Good  Language: Good   Psychomotor Activity  Psychomotor Activity:Psychomotor Activity: Normal   Assets  Assets: Communication Skills; Desire for Improvement; Housing; Health and safety inspector; Social Support; Resilience   Sleep  Sleep:Sleep: Good  Estimated Sleeping Duration (Last 24 Hours): 8.50-10.75 hours  Physical Exam: Physical Exam ROS Blood pressure 121/68, pulse 78, temperature 98.4 F (36.9 C), resp. rate 16, height 5' 7 (1.702 m), weight 124.7 kg, SpO2 95%, unknown if currently breastfeeding. Body mass index is 43.07 kg/m.  Mental Status Per Nursing Assessment::   On Admission:  Suicidal ideation indicated by patient  Demographic Factors:   Unemployed  Loss Factors: Financial problems/change in socioeconomic status  Historical Factors: Prior suicide attempts and Impulsivity  Risk Reduction Factors:   Responsible for children under 65 years of age, Sense of responsibility to family, Positive social support, Positive therapeutic relationship, and Positive coping skills or problem solving skills  Continued Clinical Symptoms:  Previous Psychiatric Diagnoses and Treatments  Cognitive Features That Contribute To Risk:  None    Suicide Risk:  Minimal: No identifiable suicidal ideation.  Patients presenting with no risk factors but with morbid ruminations; may be classified as minimal risk based on the severity of the depressive symptoms   Follow-up Information     [facility] Follow up.   Why: Appointment is scheduled for 12/12/2023 at 3:20PM.  Appointment is in person. Contact information: Address: [address] [place], [place] [address] Phone: [phone] Email: [email or web address]  Hours: Monday-Friday: 9:30am-6pm Saturday: 9am-2pm (phone calls only, please leave a message) Sunday: Closed        [facility] Follow up.   Why: Appointment is scheduled for 12/14/2023 at 8AM. Contact information: [address] [place] [address] [phone]                  Hoy CHRISTELLA Pinal, NP 12/08/2023, 8:45 AM

## 2023-12-08 NOTE — Plan of Care (Signed)

## 2023-12-08 NOTE — Progress Notes (Signed)
  [facility] Adult Case Management Discharge Plan :  Will you be returning to the same living situation after discharge:  Yes,  pt reports that she is returning home.  At discharge, do you have transportation home?: Yes,  CSW to assist with transportation needs.  Do you have the ability to pay for your medications: Yes,  [place] MEDICAID PREPAID HEALTH PLAN /  MEDICAID [facility] COMMUNITY  Release of information consent forms completed and in the chart;  Patient's signature needed at discharge.  Patient to Follow up at:  Follow-up Information     [facility] Follow up.   Why: Appointment is scheduled for 12/12/2023 at 3:20PM.  Appointment is in person. Contact information: Address: [address] [place], [place] [address] Phone: [phone] Email: [email or web address]  Hours: Monday-Friday: 9:30am-6pm Saturday: 9am-2pm (phone calls only, please leave a message) Sunday: Closed        [facility] Follow up.   Why: Appointment is scheduled for 12/14/2023 at 8AM. Contact information: [address] [place] [address] [phone]                 Next level of care provider has access to [facility] Link:no  Safety Planning and Suicide Prevention discussed: Yes,  SPE completed with the patient's sisters.      Has patient been referred to the Quitline?: Patient does not use tobacco/nicotine products  Patient has been referred for addiction treatment: No known substance use disorder.  Sherryle JINNY Margo, LCSW 12/08/2023, 9:49 AM

## 2023-12-08 NOTE — Plan of Care (Signed)
   Problem: Education: Goal: Emotional status will improve Outcome: Progressing Goal: Mental status will improve Outcome: Progressing

## 2024-03-08 ENCOUNTER — Telehealth: Payer: Self-pay | Admitting: Adult Health

## 2024-03-08 DIAGNOSIS — R0683 Snoring: Secondary | ICD-10-CM

## 2024-03-08 DIAGNOSIS — G4700 Insomnia, unspecified: Secondary | ICD-10-CM

## 2024-03-08 NOTE — Telephone Encounter (Signed)
 Patient called to state she would like an in lab study instead of at her home because she isn't to sleep with a toddler at her home. Patient was last seen in office 04/2023. Please advise

## 2024-03-11 NOTE — Telephone Encounter (Signed)
 Order placed for NPSG , please let patient know

## 2024-03-12 NOTE — Telephone Encounter (Signed)
 Pt has been scheduled and is aware. NFN

## 2024-03-14 NOTE — Telephone Encounter (Signed)
 Scheduled for 05/07/24.  Nothing further needed.

## 2024-04-24 ENCOUNTER — Encounter: Payer: Self-pay | Admitting: Obstetrics and Gynecology

## 2024-04-24 ENCOUNTER — Ambulatory Visit: Admitting: Obstetrics and Gynecology

## 2024-04-24 VITALS — BP 160/118 | HR 77 | Wt 294.6 lb

## 2024-04-24 DIAGNOSIS — N939 Abnormal uterine and vaginal bleeding, unspecified: Secondary | ICD-10-CM

## 2024-04-24 DIAGNOSIS — Z86718 Personal history of other venous thrombosis and embolism: Secondary | ICD-10-CM | POA: Diagnosis not present

## 2024-04-24 DIAGNOSIS — Z98891 History of uterine scar from previous surgery: Secondary | ICD-10-CM | POA: Insufficient documentation

## 2024-04-24 DIAGNOSIS — R7303 Prediabetes: Secondary | ICD-10-CM | POA: Insufficient documentation

## 2024-04-24 NOTE — Progress Notes (Unsigned)
 NEW GYNECOLOGY PATIENT Patient name: Candice Holder MRN [id number]  Date of birth: 1985/06/24 Chief Complaint:   Hysterectomy Consultation     History:  Discussed the use of AI scribe software for clinical note transcription with the patient, who gave verbal consent to proceed.  History of Present Illness Candice Holder is a 38 year old female with a history of DVT and uncontrolled hypertension who presents with persistent abnormal uterine bleeding.  She has experienced constant abnormal uterine bleeding for over a year, beginning after the birth of her son three years ago. The bleeding occurs daily with varying amounts and can be severe enough to soak through an ultra tampon in half an hour. Initially, the bleeding was sporadic and heavy before starting Nexplanon. The patient reports that the bleeding continued despite Nexplanon. She has not tried other medications for the bleeding.  Prior workup included a negative fibroid check, a transvaginal ultrasound, and a painful cervical procedure. Despite these investigations, the cause of the bleeding remains undetermined. The bleeding has significantly impacted her mental health and confidence, making her hesitant to leave the house due to unpredictability and embarrassment from incidents like bleeding through clothing in public.  She has a history of deep vein thrombosis (DVT) and uncontrolled hypertension, which limits treatment options for her bleeding. Her blood pressure remains high despite medication adjustments. She is scheduled for a sleep study to evaluate for sleep apnea. She is not currently monitoring her blood pressure at home but has a follow-up with her primary care provider next week.  Her past medical history includes a DVT during pregnancy, after which she was taken off blood thinners post-delivery. She was referred to a hematologist due to high platelets and was evaluated for thrombocytosis. She has had a recent A1c test in October,  which indicated prediabetes, and her thyroid was last checked in September.       Gynecologic History No LMP recorded. Patient has had an implant. Contraception: Nexplanon   OB History  Gravida Para Term Preterm AB Living  4 2 2  2 1   SAB IAB Ectopic Multiple Live Births  1 1       # Outcome Date GA Lbr Len/2nd Weight Sex Type Anes PTL Lv  4 Term 12/15/20 [redacted]w[redacted]d    CS-Unspec     3 Term 05/26/04 [redacted]w[redacted]d    CS-Unspec     2 IAB           1 SAB      SAB        The following portions of the patient's history were reviewed and updated as appropriate: allergies, current medications, past family history, past medical history, past social history, past surgical history and problem list. Health Maintenance  Topic Date Due  . COVID-19 Vaccine (1) Never done  . Hepatitis C Screening  Never done  . Hepatitis B Vaccine (1 of 3 - 19+ 3-dose series) Never done  . HPV Vaccine (1 - Risk 3-dose SCDM series) Never done  . Pap with HPV screening  Never done  . DTaP/Tdap/Td vaccine (2 - Td or Tdap) 10/05/2029  . HIV Screening  Completed  . Pneumococcal Vaccine  Aged Out  . Meningitis B Vaccine  Aged Out  . Flu Shot  Discontinued     Review of Systems Pertinent items noted in HPI and remainder of comprehensive ROS otherwise negative.  Physical Exam:  BP (!) 174/113   Pulse 76   Wt 294 lb 9.6 oz (133.6 kg)  Breastfeeding No   BMI 46.14 kg/m  Physical Exam Vitals and nursing note reviewed.  Constitutional:      Appearance: Normal appearance.  Cardiovascular:     Rate and Rhythm: Normal rate.  Pulmonary:     Effort: Pulmonary effort is normal.     Breath sounds: Normal breath sounds.  Neurological:     General: No focal deficit present.     Mental Status: She is alert and oriented to person, place, and time.  Psychiatric:        Mood and Affect: Mood normal.        Behavior: Behavior normal.        Thought Content: Thought content normal.        Judgment: Judgment normal.         Assessment and Plan:   Assessment & Plan Abnormal uterine and vaginal bleeding Chronic bleeding for over a year, worsened by scar tissue from previous C-section. Negative for fibroids. She opted for hysterectomy after discussing risks and benefits. - Scheduled hysterectomy for January. - Ordered pelvic ultrasound to assess current uterine anatomy. - Completed hysterectomy consent form. - Will schedule preoperative visit for further evaluation and discussion. - Continue Nexplanon until surgery.  History of deep vein thrombosis (DVT) Previous DVT with high platelet count. Increased risk of recurrence with surgery. Anticoagulation needed but poses bleeding risk. - Will refer to hematologist for preoperative evaluation and management of anticoagulation. - Will plan for anticoagulation around the time of surgery.  Uncontrolled hypertension Hypertension uncontrolled despite medication. Possible link to weight and sleep apnea. Blood pressure control essential for surgery. - Continue to monitor blood pressure and follow up with primary care provider next week. - Complete sleep study to evaluate for sleep apnea.   Follow-up: No follow-ups on file.      Carter Quarry, MD Obstetrician & Gynecologist, [facility] [employer], [facility]

## 2024-04-25 ENCOUNTER — Telehealth: Payer: Self-pay | Admitting: Adult Health

## 2024-04-25 DIAGNOSIS — G4700 Insomnia, unspecified: Secondary | ICD-10-CM

## 2024-04-25 DIAGNOSIS — R0683 Snoring: Secondary | ICD-10-CM

## 2024-04-25 NOTE — Telephone Encounter (Signed)
 Insurance will not cover in lab study.  Can order home sleep study if she would like with ov in 6 weeks to discuss results

## 2024-04-25 NOTE — Telephone Encounter (Signed)
-----   Message from Guy D sent at 04/25/2024  2:00 PM EST ----- Regarding: IN LAB STUDY DENIED COMMENTS: Your doctor asked for coverage of an attended sleep study. We looked at your health plan guidelines. Your records did not show that the guidelines were met. We were not told you had a home sleeping test first. Your request was denied. Your doctor may ask for review. Please speak to your doctor if you have questions. We did not receive enough information from your provider to determine if this request is medically necessary for your condition. Therefore, this service does not meet criteria for coverage at this time. We need additional information.

## 2024-04-26 ENCOUNTER — Inpatient Hospital Stay (HOSPITAL_BASED_OUTPATIENT_CLINIC_OR_DEPARTMENT_OTHER): Admission: RE | Admit: 2024-04-26 | Source: Ambulatory Visit | Admitting: Radiology

## 2024-04-26 NOTE — Telephone Encounter (Signed)
 Called and spoke to pt. Pt states she would like to proceed with a HST. HST ordered, f/u appt set for Friday, 1/9 at 1:30 virtual. NFN.

## 2024-04-30 ENCOUNTER — Inpatient Hospital Stay (HOSPITAL_BASED_OUTPATIENT_CLINIC_OR_DEPARTMENT_OTHER): Admission: RE | Admit: 2024-04-30 | Source: Ambulatory Visit | Admitting: Radiology

## 2024-05-07 ENCOUNTER — Encounter (HOSPITAL_BASED_OUTPATIENT_CLINIC_OR_DEPARTMENT_OTHER): Admitting: Pulmonary Disease

## 2024-05-13 ENCOUNTER — Telehealth: Payer: Self-pay

## 2024-05-13 ENCOUNTER — Telehealth: Payer: Self-pay | Admitting: Oncology

## 2024-05-13 NOTE — Telephone Encounter (Signed)
 Copied from CRM #[id number]. Topic: Clinical - Request for Lab/Test Order >> May 13, 2024  9:15 AM Ismael A wrote: Reason for CRM: patient is having surgery on 06/18/24, states surgeon is requesting sleep study results before surgery date, she is requesting to have the at home sleep study done a little bit sooner if possible - ph#  [phone]    Called and spoke to pt. Advised pt that I would forward her message to our Patient Care Coordinators to see if we can get HST done any earlier. It is currently scheduled for 06/04/2024.  PCCs, could you please advise?

## 2024-05-13 NOTE — Telephone Encounter (Signed)
 05/13/24 Patient having surg and pre-op evaluation was requested.

## 2024-05-16 ENCOUNTER — Other Ambulatory Visit: Payer: Self-pay | Admitting: Oncology

## 2024-05-16 DIAGNOSIS — R7989 Other specified abnormal findings of blood chemistry: Secondary | ICD-10-CM

## 2024-05-16 NOTE — Progress Notes (Signed)
 [facility] at [facility] [address] [place],  [place]  [address] [phone]  Clinic Day:  05/17/2024  Referring physician: Rosalea Rosina SAILOR, PA   HISTORY OF PRESENT ILLNESS:  The patient is a 38 y.o. 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  As she is about to undergo a hysterectomy, her gynecologist wanted make sure there were no significant hematologic issues that would preclude her from undergoing this upcoming surgery.  With respect to the mild thrombocythemia, the patient denies having any recent clotting complications.  Other than her heavy menstrual cycles, there have been no other changes in her daily quality of life.  PHYSICAL EXAM:  Blood pressure (!) 182/111, pulse 79, temperature 98.4 F (36.9 C), temperature source Oral, resp. rate 16, height 5' 3.5 (1.613 m), weight 291 lb 8 oz (132.2 kg), SpO2 99%. Wt Readings from Last 3 Encounters:  05/17/24 291 lb 8 oz (132.2 kg)  04/24/24 294 lb 9.6 oz (133.6 kg)  07/28/23 284 lb 8 oz (129 kg)   Body mass index is 50.83 kg/m. Performance status (ECOG): 0 - Asymptomatic Physical Exam Constitutional:      Appearance: Normal appearance. She is obese. She is not ill-appearing.  HENT:     Mouth/Throat:     Mouth: Mucous membranes are moist.     Pharynx: Oropharynx is clear. No oropharyngeal exudate or posterior oropharyngeal erythema.  Cardiovascular:     Rate and Rhythm: Normal rate and regular rhythm.     Heart sounds: No murmur heard.    No friction rub. No gallop.  Pulmonary:     Effort: Pulmonary effort is normal. No respiratory distress.     Breath sounds: Normal breath sounds. No wheezing, rhonchi or rales.  Abdominal:     General: Bowel sounds are normal. There is no distension.     Palpations: Abdomen is soft. There is no mass.     Tenderness: There is no abdominal tenderness.  Musculoskeletal:        General: No swelling.     Right lower leg: No edema.     Left  lower leg: No edema.  Lymphadenopathy:     Cervical: No cervical adenopathy.     Upper Body:     Right upper body: No supraclavicular or axillary adenopathy.     Left upper body: No supraclavicular or axillary adenopathy.     Lower Body: No right inguinal adenopathy. No left inguinal adenopathy.  Skin:    General: Skin is warm.     Coloration: Skin is not jaundiced.     Findings: No lesion or rash.  Neurological:     General: No focal deficit present.     Mental Status: She is alert and oriented to person, place, and time. Mental status is at baseline.  Psychiatric:        Mood and Affect: Mood normal.        Behavior: Behavior normal.        Thought Content: Thought content normal.    LABS:      Latest Ref Rng & Units 05/17/2024    2:15 PM 07/28/2023   10:45 AM 05/01/2023    4:28 PM  CBC  WBC 4.0 - 10.5 K/uL 11.5  8.7  8.8   Hemoglobin 12.0 - 15.0 g/dL 87.2  87.2  87.1   Hematocrit 36.0 - 46.0 % 37.0  36.3  37.4   Platelets 150 - 400 K/uL 430  450  445  Latest Ref Rng & Units 05/01/2023    4:28 PM 04/11/2022   12:00 AM 05/13/2021    3:10 PM  CMP  Glucose 70 - 99 mg/dL 883   894   BUN 6 - 20 mg/dL 11  7     14    Creatinine 0.44 - 1.00 mg/dL 9.41  0.5     9.14   Sodium 135 - 145 mmol/L 137  139     138   Potassium 3.5 - 5.1 mmol/L 3.4  3.6     3.5   Chloride 98 - 111 mmol/L 107  109     105   CO2 22 - 32 mmol/L 22  25     28    Calcium 8.9 - 10.3 mg/dL 9.2  8.9     8.6   Alkaline Phos 25 - 125  65       AST 13 - 35  18       ALT 7 - 35 U/L  17          This result is from an external source.    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  Once again, her platelets today are just minimally elevated.  Her iron parameters today show no evidence of iron deficiency  being present.  Furthermore, previous testing for myeloproliferative disorders came back negative.  When evaluating all of her labs, I see nothing from a hematologic standpoint which prevents her from undergoing a hysterectomy.  And she is stable from a hematologic standpoint, I will see her back in 1 year for repeat clinical assessment. The patient understands all the plans discussed today and is in agreement with them.  Zonie Crutcher DELENA Kerns, MD

## 2024-05-17 ENCOUNTER — Telehealth: Payer: Self-pay | Admitting: Oncology

## 2024-05-17 ENCOUNTER — Inpatient Hospital Stay: Attending: Oncology | Admitting: Oncology

## 2024-05-17 ENCOUNTER — Inpatient Hospital Stay

## 2024-05-17 ENCOUNTER — Other Ambulatory Visit: Payer: Self-pay | Admitting: Oncology

## 2024-05-17 DIAGNOSIS — R7989 Other specified abnormal findings of blood chemistry: Secondary | ICD-10-CM

## 2024-05-17 DIAGNOSIS — N920 Excessive and frequent menstruation with regular cycle: Secondary | ICD-10-CM | POA: Insufficient documentation

## 2024-05-17 DIAGNOSIS — D75839 Thrombocytosis, unspecified: Secondary | ICD-10-CM | POA: Insufficient documentation

## 2024-05-17 LAB — CBC WITH DIFFERENTIAL (CANCER CENTER ONLY)
Abs Immature Granulocytes: 0.04 K/uL (ref 0.00–0.07)
Basophils Absolute: 0.1 K/uL (ref 0.0–0.1)
Basophils Relative: 1 %
Eosinophils Absolute: 0.1 K/uL (ref 0.0–0.5)
Eosinophils Relative: 1 %
HCT: 37 % (ref 36.0–46.0)
Hemoglobin: 12.7 g/dL (ref 12.0–15.0)
Immature Granulocytes: 0 %
Lymphocytes Relative: 29 %
Lymphs Abs: 3.3 K/uL (ref 0.7–4.0)
MCH: 30.3 pg (ref 26.0–34.0)
MCHC: 34.3 g/dL (ref 30.0–36.0)
MCV: 88.3 fL (ref 80.0–100.0)
Monocytes Absolute: 0.4 K/uL (ref 0.1–1.0)
Monocytes Relative: 4 %
Neutro Abs: 7.5 K/uL (ref 1.7–7.7)
Neutrophils Relative %: 65 %
Platelet Count: 430 K/uL — ABNORMAL HIGH (ref 150–400)
RBC: 4.19 MIL/uL (ref 3.87–5.11)
RDW: 14.4 % (ref 11.5–15.5)
WBC Count: 11.5 K/uL — ABNORMAL HIGH (ref 4.0–10.5)
nRBC: 0 % (ref 0.0–0.2)

## 2024-05-17 LAB — IRON AND TIBC
Iron: 54 ug/dL (ref 28–170)
Saturation Ratios: 14 % (ref 10.4–31.8)
TIBC: 379 ug/dL (ref 250–450)
UIBC: 325 ug/dL

## 2024-05-17 LAB — FERRITIN: Ferritin: 129 ng/mL (ref 11–307)

## 2024-05-17 NOTE — Telephone Encounter (Signed)
 Patient has been scheduled for follow-up visit per 05/17/2024 LOS.  Pt noted appt details on personal electronic device.

## 2024-05-21 ENCOUNTER — Inpatient Hospital Stay (HOSPITAL_BASED_OUTPATIENT_CLINIC_OR_DEPARTMENT_OTHER): Admission: RE | Admit: 2024-05-21 | Admitting: Radiology

## 2024-05-30 ENCOUNTER — Inpatient Hospital Stay (HOSPITAL_BASED_OUTPATIENT_CLINIC_OR_DEPARTMENT_OTHER): Admission: RE | Admit: 2024-05-30 | Discharge: 2024-05-30 | Attending: Obstetrics and Gynecology | Admitting: Radiology

## 2024-05-30 DIAGNOSIS — N939 Abnormal uterine and vaginal bleeding, unspecified: Secondary | ICD-10-CM | POA: Diagnosis not present

## 2024-06-04 ENCOUNTER — Ambulatory Visit

## 2024-06-04 ENCOUNTER — Encounter (HOSPITAL_COMMUNITY): Payer: Self-pay | Admitting: Obstetrics and Gynecology

## 2024-06-04 DIAGNOSIS — R0683 Snoring: Secondary | ICD-10-CM

## 2024-06-04 DIAGNOSIS — G4700 Insomnia, unspecified: Secondary | ICD-10-CM

## 2024-06-04 NOTE — Progress Notes (Signed)
 Surgical Instructions  Your procedure is scheduled on :  Tuesday January 6th, 2026 Report to [facility] Main Entrance A at 11:30 AM, then check in the Admitting office. Any questions or running late day of surgery :  call [phone]  Questions prior to your surgery day:  call [phone], Monday -- Friday 8am - 4pm. If you experience any cold or flu symptoms such as cough, fever, chills, shortness of breath, etc. between now and you scheduled surgery, please notify your surgeon office.   Remember: Do Not eat any food after midnight the night before surgery. You may have clear liquids from midnight night before surgery until 10:30 AM.   Clear liquids allowed are:  Water             Carbonated Beverages (diabetics choose diet or no sugar options)  Clear Tea ( no milk, no honey, etc.)  Black coffee ( NO MILK, CREAM OR POWDERED CREAMER OF ANY KIND)  Sport drinks, like Gatorade (diabetes choose diet or no sugar options)  NO clear liquid after 10:30 AM day of surgery.  This includes No water,  candy,  gum, and mints.  Take these medicines the morning of surgery with A SIPS OF WATER:  Prozac, Caplyta , Gabapentin , Atenolol  and Wellbutrin.  May take these medicines IF NEEDED:  NONE  LAST DOSE OF MOUNJARO SHOULD BE 06/07/24. MAY RESUME AFTER SURGERY.   One week prior to surgery, STOP taking any Aspirin (unless otherwise instructed by your surgeon) Aleve , Naproxen , ibuprofen , Motrin , Advil , Goody's, BC's, all herbal medications/ supplements, fish oil, and non-prescription vitamins.  Do NOT Smoke (tobacco/ vaping) and Do Not drink alcohol for 24 hours prior to your procedure.  For those patients that use a CPAP.  Please bring your CPAP/ mask/ tubing with them day of surgery . Anesthesia may ask recovery room nurse to use and if you stay the night you be asked to use it.  You will be asked to removed any contacts, glasses, piercing's, hearing aid's, dentures/ partials prior to surgery.  Please  bring cases/ container/ solution/ etc., for them day of surgery.   Patients discharged the day of surgery will NOT be allowed to drive home.  You must have responsible driver and caregiver to stay at home with you the next 24 hours.  SURGICAL WAITING ROOM VISITATION Patients may have no more than 2 support people in the waiting area - if more than 2 , these visitors may rotate.  Pre-op nurse will coordinate an appropriate time for 1 Adult support person, who may not rotate, to accompany patient in pre-op.  Aware some patients may have certain circumstances, speak to pre-op nurse day of surgery.  Children under the age 14 must have an adult with them who is not the patient and must remain in the main waiting area with an adult.  If the patient needs to stay at the hospital during part of their recovery, the visitor guidelines for inpatient rooms apply.  Please refer to the [facility] website for the visitor guidelines for any additional information.  If you received a COVID test during your pre-op visit it is requested that you wear a mask when out in public, stay away from anyone that may not be feeling well and notify your surgeon if you develop symptoms.  If you have been in contact with anyone that has tested positive in the past 10 days notify your surgeon.     [place] - Preparing for Surgery  Before surgery,  you can play an important role. Because skin is not sterile, it needs to be as free of germs as possible. You can reduce the number of germs on your skin by washing with CHG (chlorhexidine gluconate) soap before surgery. CHG is an antiseptic cleaner which kills germs and bonds with the skin to continue killing germs even after washing. Oral hygiene is also important in reducing the risk of infection. Remember to brush your teeth with your regular toothpaste the morning of surgery.  Please DO NOT use if you have an allergy to CHG or antibacterial soaps. If your skin becomes  reddened/irritated stop using the CHG and inform your Pre-op nurse day of surgery.  DO NOT shave (including legs and genital area) for at least 48 hours prior to your CHG shower.   Please follow these instructions carefully:  Shower with CHG soap the night before surgery. If you choose to wash your hair, wash your hair first as usual with your normal shampoo. After you shampoo, rinse your hair and body thoroughly to remove the shampoo. Use CHG as you would any other liquid soap. You can apply CHG directly to the skin and wash gently with a clean washcloth or shower sponge. Apply the CHG soap to your body ONLY FROM THE NECK DOWN. Do not use on open wounds or open sores. Avoid contact with your eyes, ears, mouth, and genitals (private parts). Wash genitals (private parts) with your normal soap. Wash thoroughly, paying special attention to the area where your surgery will be performed. Thoroughly rinse your body with warm water from the neck down. DO NOT shower/wash with your normal soap after using and rinsing off the CHG soap. DO NOT use lotions, oils, etc., after showering with CHG. Pat yourself dry with a clean towel. Wear clean pajamas. Place clean sheets on your bed the night of your CHG shower and do not sleep with pets.  Day of Surgery  DO NOT Apply any lotions,  powder,  oils,  deodorants (may use underarm deodorant),  cologne/  perfumes  or makeup Do Not wear jewelry /  piercing's/  metal/  permanent jewelry must be removed prior to arrival day of surgery. (No plastic piercing) Do Not wear nail polish,  gel polish,  artificial nails, or any other type of covering on natural finger nails (toe nails are okay) Remember to brush your teeth and rinse mouth out. Put on clean / comfortable clothes. [facility] is not responsible for valuables/ personal belongings

## 2024-06-04 NOTE — Progress Notes (Signed)
 Spoke w/ via phone for pre-op interview--- [facility] needs dos----  T&S per careers adviser. CBC per protocol, BMP and UPT per anesthesia. Lab appt 06/11/24 at 1100       Lab results------ COVID test -----patient states asymptomatic no test needed Arrive at -------1130 NPO after MN NO Solid Food.  Clear liquids from MN until---1030 Pre-Surgery Ensure or G2:  Med rec completed Medications to take morning of surgery ----- Diabetic medication -----  GLP1 agonist last dose:Mounjaro weekly injections. Last dose prior to surgery will be 06/07/24. GLP1 instructions: No doses prior to surgery after the 06/07/24 dose.pt verbalized understanding.  Patient instructed no nail polish to be worn day of surgery Patient instructed to bring photo id and insurance card day of surgery Patient aware to have Driver (ride ) / caregiver    for 24 hours after surgery - Sister Delila Quint Patient Special Instructions -----CHG shower prior to surgery. Pre-Op special Instructions -----  Patient verbalized understanding of instructions that were given at this phone interview. Patient denies chest pain, sob, fever, cough at the interview.

## 2024-06-11 ENCOUNTER — Ambulatory Visit: Payer: Self-pay | Admitting: Obstetrics and Gynecology

## 2024-06-11 ENCOUNTER — Telehealth: Payer: Self-pay | Admitting: Family Medicine

## 2024-06-11 ENCOUNTER — Inpatient Hospital Stay (HOSPITAL_COMMUNITY): Admission: RE | Admit: 2024-06-11 | Source: Ambulatory Visit

## 2024-06-11 NOTE — Telephone Encounter (Signed)
 Attempted to call patient to see if she can come in tomorrow afternoon or Friday morning to have a procedure done before her scheduled surgery. Call could not be completed at this time I will try again later.

## 2024-06-12 ENCOUNTER — Telehealth: Payer: Self-pay | Admitting: Family Medicine

## 2024-06-12 ENCOUNTER — Telehealth: Payer: Self-pay | Admitting: Obstetrics and Gynecology

## 2024-06-12 NOTE — Telephone Encounter (Signed)
 Attempted to call patient to see if she can come in the office to have a procedure done before her scheduled surgery. Call could not be completed. I will send a mychart message to try to contact the pt.

## 2024-06-12 NOTE — Telephone Encounter (Signed)
 Attempted to call patient x2, call could not be completed

## 2024-06-14 ENCOUNTER — Ambulatory Visit: Admitting: Obstetrics and Gynecology

## 2024-06-14 ENCOUNTER — Encounter: Payer: Self-pay | Admitting: Obstetrics and Gynecology

## 2024-06-14 VITALS — BP 135/100 | HR 92 | Wt 289.5 lb

## 2024-06-14 DIAGNOSIS — Z86718 Personal history of other venous thrombosis and embolism: Secondary | ICD-10-CM | POA: Diagnosis not present

## 2024-06-14 DIAGNOSIS — Z3046 Encounter for surveillance of implantable subdermal contraceptive: Secondary | ICD-10-CM

## 2024-06-14 DIAGNOSIS — R7989 Other specified abnormal findings of blood chemistry: Secondary | ICD-10-CM | POA: Diagnosis not present

## 2024-06-14 DIAGNOSIS — I10 Essential (primary) hypertension: Secondary | ICD-10-CM | POA: Diagnosis not present

## 2024-06-14 DIAGNOSIS — N939 Abnormal uterine and vaginal bleeding, unspecified: Secondary | ICD-10-CM | POA: Diagnosis not present

## 2024-06-14 NOTE — H&P (View-Only) (Signed)
 "   GYNECOLOGY VISIT  Patient name: Candice Holder MRN [id number]  Date of birth: 18-Jul-1985 Chief Complaint:   Procedure  History:  [name] here for exam and discussion of surgery. Had ultrasound completed recently. Hx of DVT/PE with prior lovenox use without issue. Not currently on any of her pain medications due to her BP control and primary recommended she hold the pain meds. Recently switched from atenolol  to a combo med so not taking meloxicam, gabapentin , or ibuprofen . Pain si mostly in back from scoliosis. Felt that ibuprofen  worked better than meloxicam. She has not problems with taking oxycodone. Does not have anymore gabapentin  at home. Last monjoura shot was last week.  Nexplanon in left arm placed in 2024.     The following portions of the patient's history were reviewed and updated as appropriate: allergies, current medications, past family history, past medical history, past social history, past surgical history and problem list.   Health Maintenance:   Last pap No results found for: DIAGPAP, HPVHIGH, ADEQPAP  Health Maintenance  Topic Date Due   COVID-19 Vaccine (1) Never done   Hepatitis C Screening  Never done   Hepatitis B Vaccine (1 of 3 - 19+ 3-dose series) Never done   HPV Vaccine (1 - Risk 3-dose SCDM series) Never done   Pap with HPV screening  Never done   DTaP/Tdap/Td vaccine (2 - Td or Tdap) 10/05/2029   HIV Screening  Completed   Pneumococcal Vaccine  Aged Out   Meningitis B Vaccine  Aged Out   Flu Shot  Discontinued      Review of Systems:  Pertinent items are noted in HPI. Comprehensive review of systems was otherwise negative.   Objective:  Physical Exam BP (!) 135/100 (BP Location: Left Arm, Patient Position: Sitting, Cuff Size: Large)   Pulse 92   Wt 289 lb 8 oz (131.3 kg)   BMI 50.48 kg/m    Physical Exam Vitals and nursing note reviewed. Exam conducted with a chaperone present.  Constitutional:      Appearance: Normal  appearance.  HENT:     Head: Normocephalic and atraumatic.  Pulmonary:     Effort: Pulmonary effort is normal.     Breath sounds: Normal breath sounds.  Abdominal:     Comments: Well healed low transverse incisions Small umbilicus   Genitourinary:    General: Normal vulva.     Exam position: Lithotomy position.     Vagina: Normal.     Cervix: Normal.     Comments: Small cervix, anterior likely drawn up by prior cesarean Skin:    General: Skin is warm and dry.  Neurological:     General: No focal deficit present.     Mental Status: She is alert.  Psychiatric:        Mood and Affect: Mood normal.        Behavior: Behavior normal.        Thought Content: Thought content normal.        Judgment: Judgment normal.     NEXPLANON REMOVAL The risks (including infection, bleeding, pain, and uterine perforation) and benefits of the procedure were explained to the patient and Written informed consent was obtained.   Device was palpated in left upper arm. After time out, the skin was cleaned with alcohol and infiltrated with 2cc of 1% lidocaine  was used to infiltrate the skin and subcutaneous tissue deep to the device. The area was cleaned with betadine x2.  Using an 11 blade, the  skin was incised and the implant was removed intact.  The implant was shown to the patient.  The skin was cleaned, incision covered with Steri-Strips, and an adhesive bandage.  Arm was wrapped and post procedure instructions provided to the patient.     Labs and Imaging US  PELVIC COMPLETE WITH TRANSVAGINAL Result Date: 06/11/2024 CLINICAL DATA:  anormal uterine bleeding - irregular EXAM: TRANSABDOMINAL AND TRANSVAGINAL ULTRASOUND OF PELVIS TECHNIQUE: Both transabdominal and transvaginal ultrasound examinations of the pelvis were performed. Transabdominal technique was performed for global imaging of the pelvis including uterus, ovaries, adnexal regions, and pelvic cul-de-sac. It was necessary to proceed with  endovaginal exam following the transabdominal exam to visualize the uterus and adnexae in better detail. COMPARISON:  11/12/2017 CT without contrast FINDINGS: Uterus Measurements: 11.2 x 5.7 x 5.5 cm = volume: 184 mL. No fibroids or other mass visualized. Endometrium Thickness: 4 mm.  No focal abnormality visualized. Right ovary Measurements: 5.9 x 3.9 x 4.7 cm = volume: 56.9 mL. Anechoic simple appearing right ovarian cyst measures 3.9 x 2.6 x 2.9 cm Left ovary Not visualized, limited because of body habitus and bowel gas Other findings No abnormal free fluid. IMPRESSION: 1. 3.9 cm simple appearing right ovarian cyst. No follow up imaging recommended. Note: This recommendation does not apply to premenarchal patients or to those with increased risk (genetic, family history, elevated tumor markers or other high-risk factors) of ovarian cancer. Reference: Radiology 2019 Nov; 293(2):359-371. 2. Nonvisualization of the left ovary. 3. Normal appearance of the uterus and endometrium. Electronically Signed   By: CHRISTELLA.  Shick M.D.   On: 06/11/2024 11:12       Assessment & Plan:   1. Abnormal uterine bleeding (AUB) (Primary) Scheduled for RA-TLH, BS cysto. Reviewed postop meds of scheduled APAP/NSAIDs and prn oxycodone. Has family for transport. Reviewed role that prior cesareans may play. EMB from 02/2023 benign and has had nexplanon in place since with thin [facility]. Patient previously did not tolerate EMB very well and cervix very high, will follow up final surgical pathology.  2. Elevated platelet count 3. History of deep vein thrombosis Elevated risk of DVT given hx, will need prophylactic LVX postop for 10 days. Comfortable with LVX use.   4. Essential hypertension Discussed importance of control. PCP managing. Will be assessed by anesthesia preop. Will need to take analgesia preop so BP may or may not be affected by it's use or pain in the postop period.    Patient desires surgical management with RA-TLH, BS,  cysto.  The risks of surgery were discussed in detail with the patient including but not limited to: bleeding which may require transfusion or reoperation; infection which may require prolonged hospitalization or re-hospitalization and antibiotic therapy; injury to bowel, bladder, ureters and major vessels or other surrounding organs which may lead to other procedures; formation of adhesions; need for additional procedures including laparotomy or subsequent procedures secondary to intraoperative injury or abnormal pathology; thromboembolic phenomenon; incisional problems and other postoperative or anesthesia complications.  Patient was told that the likelihood that her condition and symptoms will be treated effectively with this surgical management was high; the postoperative expectations were also discussed in detail. The patient also understands the alternative treatment options which were discussed in full. All questions were answered.      Carter Quarry, MD Minimally Invasive Gynecologic [facility] "

## 2024-06-14 NOTE — Progress Notes (Signed)
 "   GYNECOLOGY VISIT  Patient name: Candice Holder MRN [id number]  Date of birth: 18-Jul-1985 Chief Complaint:   Procedure  History:  [name] here for exam and discussion of surgery. Had ultrasound completed recently. Hx of DVT/PE with prior lovenox use without issue. Not currently on any of her pain medications due to her BP control and primary recommended she hold the pain meds. Recently switched from atenolol  to a combo med so not taking meloxicam, gabapentin , or ibuprofen . Pain si mostly in back from scoliosis. Felt that ibuprofen  worked better than meloxicam. She has not problems with taking oxycodone. Does not have anymore gabapentin  at home. Last monjoura shot was last week.  Nexplanon in left arm placed in 2024.     The following portions of the patient's history were reviewed and updated as appropriate: allergies, current medications, past family history, past medical history, past social history, past surgical history and problem list.   Health Maintenance:   Last pap No results found for: DIAGPAP, HPVHIGH, ADEQPAP  Health Maintenance  Topic Date Due   COVID-19 Vaccine (1) Never done   Hepatitis C Screening  Never done   Hepatitis B Vaccine (1 of 3 - 19+ 3-dose series) Never done   HPV Vaccine (1 - Risk 3-dose SCDM series) Never done   Pap with HPV screening  Never done   DTaP/Tdap/Td vaccine (2 - Td or Tdap) 10/05/2029   HIV Screening  Completed   Pneumococcal Vaccine  Aged Out   Meningitis B Vaccine  Aged Out   Flu Shot  Discontinued      Review of Systems:  Pertinent items are noted in HPI. Comprehensive review of systems was otherwise negative.   Objective:  Physical Exam BP (!) 135/100 (BP Location: Left Arm, Patient Position: Sitting, Cuff Size: Large)   Pulse 92   Wt 289 lb 8 oz (131.3 kg)   BMI 50.48 kg/m    Physical Exam Vitals and nursing note reviewed. Exam conducted with a chaperone present.  Constitutional:      Appearance: Normal  appearance.  HENT:     Head: Normocephalic and atraumatic.  Pulmonary:     Effort: Pulmonary effort is normal.     Breath sounds: Normal breath sounds.  Abdominal:     Comments: Well healed low transverse incisions Small umbilicus   Genitourinary:    General: Normal vulva.     Exam position: Lithotomy position.     Vagina: Normal.     Cervix: Normal.     Comments: Small cervix, anterior likely drawn up by prior cesarean Skin:    General: Skin is warm and dry.  Neurological:     General: No focal deficit present.     Mental Status: She is alert.  Psychiatric:        Mood and Affect: Mood normal.        Behavior: Behavior normal.        Thought Content: Thought content normal.        Judgment: Judgment normal.     NEXPLANON REMOVAL The risks (including infection, bleeding, pain, and uterine perforation) and benefits of the procedure were explained to the patient and Written informed consent was obtained.   Device was palpated in left upper arm. After time out, the skin was cleaned with alcohol and infiltrated with 2cc of 1% lidocaine  was used to infiltrate the skin and subcutaneous tissue deep to the device. The area was cleaned with betadine x2.  Using an 11 blade, the  skin was incised and the implant was removed intact.  The implant was shown to the patient.  The skin was cleaned, incision covered with Steri-Strips, and an adhesive bandage.  Arm was wrapped and post procedure instructions provided to the patient.     Labs and Imaging US  PELVIC COMPLETE WITH TRANSVAGINAL Result Date: 06/11/2024 CLINICAL DATA:  anormal uterine bleeding - irregular EXAM: TRANSABDOMINAL AND TRANSVAGINAL ULTRASOUND OF PELVIS TECHNIQUE: Both transabdominal and transvaginal ultrasound examinations of the pelvis were performed. Transabdominal technique was performed for global imaging of the pelvis including uterus, ovaries, adnexal regions, and pelvic cul-de-sac. It was necessary to proceed with  endovaginal exam following the transabdominal exam to visualize the uterus and adnexae in better detail. COMPARISON:  11/12/2017 CT without contrast FINDINGS: Uterus Measurements: 11.2 x 5.7 x 5.5 cm = volume: 184 mL. No fibroids or other mass visualized. Endometrium Thickness: 4 mm.  No focal abnormality visualized. Right ovary Measurements: 5.9 x 3.9 x 4.7 cm = volume: 56.9 mL. Anechoic simple appearing right ovarian cyst measures 3.9 x 2.6 x 2.9 cm Left ovary Not visualized, limited because of body habitus and bowel gas Other findings No abnormal free fluid. IMPRESSION: 1. 3.9 cm simple appearing right ovarian cyst. No follow up imaging recommended. Note: This recommendation does not apply to premenarchal patients or to those with increased risk (genetic, family history, elevated tumor markers or other high-risk factors) of ovarian cancer. Reference: Radiology 2019 Nov; 293(2):359-371. 2. Nonvisualization of the left ovary. 3. Normal appearance of the uterus and endometrium. Electronically Signed   By: CHRISTELLA.  Shick M.D.   On: 06/11/2024 11:12       Assessment & Plan:   1. Abnormal uterine bleeding (AUB) (Primary) Scheduled for RA-TLH, BS cysto. Reviewed postop meds of scheduled APAP/NSAIDs and prn oxycodone. Has family for transport. Reviewed role that prior cesareans may play. EMB from 02/2023 benign and has had nexplanon in place since with [facility]. Patient previously did not tolerate EMB very well and cervix very high, will follow up final surgical pathology.  2. Elevated platelet count 3. History of deep vein thrombosis Elevated risk of DVT given hx, will need prophylactic LVX postop for 10 days. Comfortable with LVX use.   4. Essential hypertension Discussed importance of control. PCP managing. Will be assessed by anesthesia preop. Will need to take analgesia preop so BP may or may not be affected by it's use or pain in the postop period.    Patient desires surgical management with RA-TLH, BS,  cysto.  The risks of surgery were discussed in detail with the patient including but not limited to: bleeding which may require transfusion or reoperation; infection which may require prolonged hospitalization or re-hospitalization and antibiotic therapy; injury to bowel, bladder, ureters and major vessels or other surrounding organs which may lead to other procedures; formation of adhesions; need for additional procedures including laparotomy or subsequent procedures secondary to intraoperative injury or abnormal pathology; thromboembolic phenomenon; incisional problems and other postoperative or anesthesia complications.  Patient was told that the likelihood that her condition and symptoms will be treated effectively with this surgical management was high; the postoperative expectations were also discussed in detail. The patient also understands the alternative treatment options which were discussed in full. All questions were answered.      Carter Quarry, MD Minimally Invasive Gynecologic [facility] "

## 2024-06-14 NOTE — Patient Instructions (Signed)
 Your NEXPLANON implant was just removed Here is some helpful information on what you can expect and how to care for your removal site. 24 Hours wear your top bandage The compression bandage helps minimize bruising.  3-5 Days keep your implant site covered While the insertion site is healing, keep the area covered with a smaller bandage.

## 2024-06-15 ENCOUNTER — Telehealth: Payer: Self-pay | Admitting: Pulmonary Disease

## 2024-06-15 DIAGNOSIS — G4733 Obstructive sleep apnea (adult) (pediatric): Secondary | ICD-10-CM | POA: Diagnosis not present

## 2024-06-15 NOTE — Telephone Encounter (Signed)
 Call patient  Sleep study result  Date of study: 06/05/2024  Impression: Severe obstructive sleep apnea with mild oxygen desaturations  Recommendation: Recommend CPAP therapy for severe obstructive sleep apnea. Auto-titrating CPAP with pressure settings of 5-15 should be appropriate, along with heated humidification using the patient's preferred mask. Avoid alcohol, sedatives and other CNS depressants that may worsen sleep apnea and disrupt normal sleep architecture. Sleep hygiene should be reviewed to assess factors that may improve sleep quality. Weight management and regular exercise should be initiated or continued Schedule follow-up in the office 4 to 6 weeks after initiation of treatment

## 2024-06-17 ENCOUNTER — Encounter (HOSPITAL_COMMUNITY)
Admission: RE | Admit: 2024-06-17 | Discharge: 2024-06-17 | Disposition: A | Discharge status: Home / Self Care | Source: Ambulatory Visit | Attending: Obstetrics and Gynecology | Admitting: Obstetrics and Gynecology

## 2024-06-17 DIAGNOSIS — Z01818 Encounter for other preprocedural examination: Secondary | ICD-10-CM

## 2024-06-17 DIAGNOSIS — I10 Essential (primary) hypertension: Secondary | ICD-10-CM | POA: Diagnosis not present

## 2024-06-17 DIAGNOSIS — Z01812 Encounter for preprocedural laboratory examination: Secondary | ICD-10-CM | POA: Insufficient documentation

## 2024-06-17 LAB — BASIC METABOLIC PANEL WITH GFR
Anion gap: 8 (ref 5–15)
BUN: 9 mg/dL (ref 6–20)
CO2: 28 mmol/L (ref 22–32)
Calcium: 9.3 mg/dL (ref 8.9–10.3)
Chloride: 105 mmol/L (ref 98–111)
Creatinine, Ser: 0.62 mg/dL (ref 0.44–1.00)
GFR, Estimated: >60 mL/min
Glucose, Bld: 99 mg/dL (ref 70–99)
Potassium: 3.7 mmol/L (ref 3.5–5.1)
Sodium: 141 mmol/L (ref 135–145)

## 2024-06-17 LAB — TYPE AND SCREEN
ABO/RH(D): O POS
Antibody Screen: NEGATIVE

## 2024-06-17 LAB — CBC
HCT: 38.1 % (ref 36.0–46.0)
Hemoglobin: 12.8 g/dL (ref 12.0–15.0)
MCH: 30.1 pg (ref 26.0–34.0)
MCHC: 33.6 g/dL (ref 30.0–36.0)
MCV: 89.6 fL (ref 80.0–100.0)
Platelets: 438 K/uL — ABNORMAL HIGH (ref 150–400)
RBC: 4.25 MIL/uL (ref 3.87–5.11)
RDW: 14.1 % (ref 11.5–15.5)
WBC: 9 K/uL (ref 4.0–10.5)
nRBC: 0 % (ref 0.0–0.2)

## 2024-06-17 NOTE — Telephone Encounter (Signed)
 Will discuss at ov on 06/21/24.

## 2024-06-17 NOTE — Telephone Encounter (Signed)
 Called and spoke with the pt and advised of HST results. Pt verbalized understanding and will keep ov schedule for 1/9 to review results in detail and decide on plan. NFN

## 2024-06-17 NOTE — Telephone Encounter (Signed)
 Please let her know that she needs to keep ov to discuss in full detail and decide on tx plan

## 2024-06-18 ENCOUNTER — Ambulatory Visit (HOSPITAL_COMMUNITY)
Admission: RE | Disposition: A | Discharge status: Home / Self Care | Payer: Self-pay | Source: Home / Self Care | Attending: Obstetrics and Gynecology

## 2024-06-18 ENCOUNTER — Other Ambulatory Visit: Payer: Self-pay

## 2024-06-18 ENCOUNTER — Other Ambulatory Visit (HOSPITAL_COMMUNITY): Payer: Self-pay

## 2024-06-18 ENCOUNTER — Ambulatory Visit (HOSPITAL_COMMUNITY)

## 2024-06-18 ENCOUNTER — Ambulatory Visit (HOSPITAL_COMMUNITY)
Admission: RE | Admit: 2024-06-18 | Discharge: 2024-06-18 | Disposition: A | Discharge status: Home / Self Care | Attending: Obstetrics and Gynecology | Admitting: Obstetrics and Gynecology

## 2024-06-18 ENCOUNTER — Encounter (HOSPITAL_COMMUNITY): Payer: Self-pay | Admitting: Obstetrics and Gynecology

## 2024-06-18 DIAGNOSIS — N840 Polyp of corpus uteri: Secondary | ICD-10-CM | POA: Insufficient documentation

## 2024-06-18 DIAGNOSIS — N838 Other noninflammatory disorders of ovary, fallopian tube and broad ligament: Secondary | ICD-10-CM | POA: Diagnosis not present

## 2024-06-18 DIAGNOSIS — N938 Other specified abnormal uterine and vaginal bleeding: Secondary | ICD-10-CM

## 2024-06-18 DIAGNOSIS — F418 Other specified anxiety disorders: Secondary | ICD-10-CM | POA: Diagnosis not present

## 2024-06-18 DIAGNOSIS — N736 Female pelvic peritoneal adhesions (postinfective): Secondary | ICD-10-CM

## 2024-06-18 DIAGNOSIS — N939 Abnormal uterine and vaginal bleeding, unspecified: Secondary | ICD-10-CM | POA: Diagnosis present

## 2024-06-18 DIAGNOSIS — I10 Essential (primary) hypertension: Secondary | ICD-10-CM | POA: Diagnosis not present

## 2024-06-18 DIAGNOSIS — Z86718 Personal history of other venous thrombosis and embolism: Secondary | ICD-10-CM | POA: Diagnosis not present

## 2024-06-18 DIAGNOSIS — Z01818 Encounter for other preprocedural examination: Secondary | ICD-10-CM

## 2024-06-18 HISTORY — PX: ROBOTIC ASSISTED LAPAROSCOPIC LYSIS OF ADHESION: SHX6080

## 2024-06-18 HISTORY — PX: CYSTOSCOPY: SHX5120

## 2024-06-18 HISTORY — DX: Prediabetes: R73.03

## 2024-06-18 HISTORY — PX: ROBOTIC ASSISTED TOTAL HYSTERECTOMY WITH BILATERAL SALPINGO OOPHERECTOMY: SHX6086

## 2024-06-18 LAB — POCT PREGNANCY, URINE: Preg Test, Ur: NEGATIVE

## 2024-06-18 SURGERY — HYSTERECTOMY, TOTAL, ROBOT-ASSISTED, LAPAROSCOPIC, WITH BILATERAL SALPINGO-OOPHORECTOMY
Anesthesia: General

## 2024-06-18 MED ORDER — PROPOFOL 1000 MG/100ML IV EMUL
INTRAVENOUS | Status: AC
  Filled 2024-06-18: qty 400

## 2024-06-18 MED ORDER — FENTANYL CITRATE (PF) 100 MCG/2ML IJ SOLN
INTRAMUSCULAR | Status: DC | PRN
  Administered 2024-06-18 (×2): 50 ug via INTRAVENOUS

## 2024-06-18 MED ORDER — ESMOLOL HCL 100 MG/10ML IV SOLN
INTRAVENOUS | Status: DC | PRN
  Administered 2024-06-18 (×2): 20 mg via INTRAVENOUS

## 2024-06-18 MED ORDER — CEFAZOLIN SODIUM-DEXTROSE 3-4 GM/150ML-% IV SOLN
3.0000 g | INTRAVENOUS | Status: AC
  Administered 2024-06-18: 3 g via INTRAVENOUS

## 2024-06-18 MED ORDER — METRONIDAZOLE 500 MG/100ML IV SOLN
INTRAVENOUS | Status: AC
  Filled 2024-06-18: qty 100

## 2024-06-18 MED ORDER — PROPOFOL 500 MG/50ML IV EMUL
INTRAVENOUS | Status: DC | PRN
  Administered 2024-06-18: 120 ug/kg/min via INTRAVENOUS

## 2024-06-18 MED ORDER — FLUORESCEIN SODIUM 10 % IV SOLN
INTRAVENOUS | Status: DC | PRN
  Administered 2024-06-18: 25 mg via INTRAVENOUS

## 2024-06-18 MED ORDER — MIDAZOLAM HCL 2 MG/2ML IJ SOLN
INTRAMUSCULAR | Status: AC
  Filled 2024-06-18: qty 2

## 2024-06-18 MED ORDER — HEPARIN SODIUM (PORCINE) 5000 UNIT/ML IJ SOLN
INTRAMUSCULAR | Status: AC
  Administered 2024-06-18: 5000 [IU] via SUBCUTANEOUS
  Filled 2024-06-18: qty 1

## 2024-06-18 MED ORDER — FENTANYL CITRATE (PF) 100 MCG/2ML IJ SOLN
INTRAMUSCULAR | Status: AC
  Filled 2024-06-18: qty 2

## 2024-06-18 MED ORDER — HEMOSTATIC AGENTS (NO CHARGE) OPTIME
TOPICAL | Status: DC | PRN
  Administered 2024-06-18: 1 via TOPICAL

## 2024-06-18 MED ORDER — BUPIVACAINE LIPOSOME 1.3 % IJ SUSP
INTRAMUSCULAR | Status: DC | PRN
  Administered 2024-06-18 (×2): 10 mL

## 2024-06-18 MED ORDER — BUPIVACAINE LIPOSOME 1.3 % IJ SUSP
INTRAMUSCULAR | Status: AC
  Filled 2024-06-18: qty 20

## 2024-06-18 MED ORDER — IBUPROFEN 800 MG PO TABS
800.0000 mg | ORAL_TABLET | Freq: Three times a day (TID) | ORAL | 0 refills | Status: AC | PRN
  Filled 2024-06-18: qty 90, 30d supply, fill #0

## 2024-06-18 MED ORDER — PHENYLEPHRINE HCL-NACL 20-0.9 MG/250ML-% IV SOLN
INTRAVENOUS | Status: DC | PRN
  Administered 2024-06-18: 30 ug/min via INTRAVENOUS

## 2024-06-18 MED ORDER — CHLORHEXIDINE GLUCONATE 0.12 % MT SOLN: 15.0000 mL | Freq: Once | OROMUCOSAL | Status: AC

## 2024-06-18 MED ORDER — KETAMINE HCL 50 MG/5ML IJ SOSY
PREFILLED_SYRINGE | INTRAMUSCULAR | Status: AC
  Filled 2024-06-18: qty 5

## 2024-06-18 MED ORDER — DEXAMETHASONE SOD PHOSPHATE PF 10 MG/ML IJ SOLN
INTRAMUSCULAR | Status: DC | PRN
  Administered 2024-06-18: 5 mg via INTRAVENOUS

## 2024-06-18 MED ORDER — OXYCODONE HCL 5 MG PO TABS
5.0000 mg | ORAL_TABLET | ORAL | 0 refills | Status: AC | PRN
  Filled 2024-06-18: qty 20, 4d supply, fill #0

## 2024-06-18 MED ORDER — ONDANSETRON HCL 4 MG/2ML IJ SOLN
INTRAMUSCULAR | Status: DC | PRN
  Administered 2024-06-18: 4 mg via INTRAVENOUS

## 2024-06-18 MED ORDER — GABAPENTIN 300 MG PO CAPS
ORAL_CAPSULE | ORAL | Status: AC
  Administered 2024-06-18: 300 mg via ORAL
  Filled 2024-06-18: qty 1

## 2024-06-18 MED ORDER — ORAL CARE MOUTH RINSE: 15.0000 mL | Freq: Once | OROMUCOSAL | Status: AC

## 2024-06-18 MED ORDER — ROCURONIUM BROMIDE 10 MG/ML (PF) SYRINGE
PREFILLED_SYRINGE | INTRAVENOUS | Status: DC | PRN
  Administered 2024-06-18 (×3): 10 mg via INTRAVENOUS
  Administered 2024-06-18: 60 mg via INTRAVENOUS
  Administered 2024-06-18: 10 mg via INTRAVENOUS
  Administered 2024-06-18 (×2): 20 mg via INTRAVENOUS

## 2024-06-18 MED ORDER — PROPOFOL 10 MG/ML IV BOLUS
INTRAVENOUS | Status: DC | PRN
  Administered 2024-06-18: 200 mg via INTRAVENOUS

## 2024-06-18 MED ORDER — HYDROMORPHONE HCL 1 MG/ML IJ SOLN
INTRAMUSCULAR | Status: DC | PRN
  Administered 2024-06-18: .5 mg via INTRAVENOUS

## 2024-06-18 MED ORDER — CEFAZOLIN SODIUM-DEXTROSE 1-4 GM/50ML-% IV SOLN
INTRAVENOUS | Status: AC
  Filled 2024-06-18: qty 50

## 2024-06-18 MED ORDER — LIDOCAINE 2% (20 MG/ML) 5 ML SYRINGE
INTRAMUSCULAR | Status: DC | PRN
  Administered 2024-06-18: 50 mg via INTRAVENOUS

## 2024-06-18 MED ORDER — GABAPENTIN 300 MG PO CAPS: 300.0000 mg | ORAL_CAPSULE | ORAL | Status: AC

## 2024-06-18 MED ORDER — CHLORHEXIDINE GLUCONATE 0.12 % MT SOLN
OROMUCOSAL | Status: AC
  Administered 2024-06-18: 15 mL via OROMUCOSAL
  Filled 2024-06-18: qty 15

## 2024-06-18 MED ORDER — DROPERIDOL 2.5 MG/ML IJ SOLN
INTRAMUSCULAR | Status: AC
  Filled 2024-06-18: qty 2

## 2024-06-18 MED ORDER — OXYCODONE HCL 5 MG/5ML PO SOLN: 5.0000 mg | Freq: Once | ORAL | Status: DC | PRN

## 2024-06-18 MED ORDER — ACETAMINOPHEN 10 MG/ML IV SOLN: 1000.0000 mg | Freq: Once | INTRAVENOUS | Status: DC | PRN

## 2024-06-18 MED ORDER — BUPIVACAINE HCL (PF) 0.25 % IJ SOLN
INTRAMUSCULAR | Status: DC | PRN
  Administered 2024-06-18 (×2): 20 mL

## 2024-06-18 MED ORDER — POVIDONE-IODINE 10 % EX SWAB: 2.0000 | Freq: Once | CUTANEOUS | Status: DC

## 2024-06-18 MED ORDER — ACETAMINOPHEN 500 MG PO TABS: 1000.0000 mg | ORAL_TABLET | ORAL | Status: AC

## 2024-06-18 MED ORDER — BUPIVACAINE HCL (PF) 0.25 % IJ SOLN: INTRAMUSCULAR | Status: DC | PRN

## 2024-06-18 MED ORDER — SUGAMMADEX SODIUM 200 MG/2ML IV SOLN
INTRAVENOUS | Status: DC | PRN
  Administered 2024-06-18: 600 mg via INTRAVENOUS

## 2024-06-18 MED ORDER — ENOXAPARIN SODIUM 40 MG/0.4ML IJ SOSY
40.0000 mg | PREFILLED_SYRINGE | Freq: Two times a day (BID) | INTRAMUSCULAR | 0 refills | Status: AC
  Filled 2024-06-18: qty 8, 10d supply, fill #0

## 2024-06-18 MED ORDER — METRONIDAZOLE 500 MG/100ML IV SOLN
500.0000 mg | INTRAVENOUS | Status: AC
  Administered 2024-06-18: 500 mg via INTRAVENOUS

## 2024-06-18 MED ORDER — OXYCODONE HCL 5 MG PO TABS: 5.0000 mg | ORAL_TABLET | Freq: Once | ORAL | Status: DC | PRN

## 2024-06-18 MED ORDER — KETAMINE HCL 10 MG/ML IJ SOLN
INTRAMUSCULAR | Status: DC | PRN
  Administered 2024-06-18: 30 mg via INTRAVENOUS
  Administered 2024-06-18 (×2): 10 mg via INTRAVENOUS

## 2024-06-18 MED ORDER — SODIUM CHLORIDE 0.9 % IR SOLN
Status: DC | PRN
  Administered 2024-06-18: 1000 mL

## 2024-06-18 MED ORDER — HYDROMORPHONE HCL 1 MG/ML IJ SOLN
INTRAMUSCULAR | Status: AC
  Filled 2024-06-18: qty 0.5

## 2024-06-18 MED ORDER — LACTATED RINGERS IV SOLN: INTRAVENOUS | Status: DC

## 2024-06-18 MED ORDER — SODIUM CHLORIDE 0.9 % IV SOLN: 12.5000 mg | Freq: Once | INTRAVENOUS | Status: DC

## 2024-06-18 MED ORDER — PROPOFOL 10 MG/ML IV BOLUS
INTRAVENOUS | Status: AC
  Filled 2024-06-18: qty 20

## 2024-06-18 MED ORDER — HEPARIN SODIUM (PORCINE) 5000 UNIT/ML IJ SOLN: 5000.0000 [IU] | INTRAMUSCULAR | Status: AC

## 2024-06-18 MED ORDER — CEFAZOLIN SODIUM-DEXTROSE 2-4 GM/100ML-% IV SOLN
INTRAVENOUS | Status: AC
  Filled 2024-06-18: qty 100

## 2024-06-18 MED ORDER — DROPERIDOL 2.5 MG/ML IJ SOLN
0.6250 mg | Freq: Once | INTRAMUSCULAR | Status: AC | PRN
  Administered 2024-06-18: 0.625 mg via INTRAVENOUS

## 2024-06-18 MED ORDER — 0.9 % SODIUM CHLORIDE (POUR BTL) OPTIME
TOPICAL | Status: DC | PRN
  Administered 2024-06-18: 1000 mL

## 2024-06-18 MED ORDER — ACETAMINOPHEN 500 MG PO TABS
ORAL_TABLET | ORAL | Status: AC
  Administered 2024-06-18: 1000 mg via ORAL
  Filled 2024-06-18: qty 2

## 2024-06-18 MED ORDER — FENTANYL CITRATE (PF) 100 MCG/2ML IJ SOLN
25.0000 ug | INTRAMUSCULAR | Status: DC | PRN
  Administered 2024-06-18: 50 ug via INTRAVENOUS

## 2024-06-18 MED ORDER — GABAPENTIN 300 MG PO CAPS
300.0000 mg | ORAL_CAPSULE | Freq: Three times a day (TID) | ORAL | 0 refills | Status: AC | PRN
  Filled 2024-06-18: qty 30, 10d supply, fill #0

## 2024-06-18 MED ORDER — SODIUM CHLORIDE 0.9 % IV SOLN
12.5000 mg | Freq: Once | INTRAVENOUS | Status: DC
  Filled 2024-06-18: qty 0.5

## 2024-06-18 MED ORDER — MIDAZOLAM HCL (PF) 2 MG/2ML IJ SOLN
INTRAMUSCULAR | Status: DC | PRN
  Administered 2024-06-18: 2 mg via INTRAVENOUS

## 2024-06-18 MED ORDER — ACETAMINOPHEN 500 MG PO TABS
1000.0000 mg | ORAL_TABLET | Freq: Four times a day (QID) | ORAL | 1 refills | Status: AC | PRN
  Filled 2024-06-18: qty 120, 15d supply, fill #0

## 2024-06-18 SURGICAL SUPPLY — 49 items
BARRIER ADHS 3X4 INTERCEED (GAUZE/BANDAGES/DRESSINGS) IMPLANT
CHLORAPREP W/TINT 26 (MISCELLANEOUS) ×2 IMPLANT
COVER BACK TABLE 60X90IN (DRAPES) ×2 IMPLANT
COVER TIP SHEARS 8 DVNC (MISCELLANEOUS) ×2 IMPLANT
DEFOGGER SCOPE WARM SEASHARP (MISCELLANEOUS) ×2 IMPLANT
DERMABOND ADVANCED .7 DNX12 (GAUZE/BANDAGES/DRESSINGS) ×2 IMPLANT
DRAPE ARM DVNC X/XI (DISPOSABLE) ×8 IMPLANT
DRAPE COLUMN DVNC XI (DISPOSABLE) ×2 IMPLANT
DRAPE SURG IRRIG POUCH 19X23 (DRAPES) ×2 IMPLANT
DRAPE UTILITY XL STRL (DRAPES) ×2 IMPLANT
DRIVER NDLE MEGA SUTCUT DVNCXI (INSTRUMENTS) ×2 IMPLANT
ELECTRODE REM PT RTRN 9FT ADLT (ELECTROSURGICAL) ×2 IMPLANT
FORCEPS BPLR FENES DVNC XI (FORCEP) ×2 IMPLANT
FORCEPS PROGRASP DVNC XI (FORCEP) ×2 IMPLANT
GAUZE 4X4 16PLY ~~LOC~~+RFID DBL (SPONGE) IMPLANT
GLOVE BIOGEL PI IND STRL 7.0 (GLOVE) ×6 IMPLANT
GLOVE BIOGEL PI MICRO STRL 7 (GLOVE) ×6 IMPLANT
GOWN STRL REUS W/ TWL XL LVL3 (GOWN DISPOSABLE) ×6 IMPLANT
HIBICLENS CHG 4% 4OZ BTL (MISCELLANEOUS) ×4 IMPLANT
HOLDER FOLEY CATH W/STRAP (MISCELLANEOUS) ×2 IMPLANT
IRRIGATION SUCT STRKRFLW 2 WTP (MISCELLANEOUS) ×2 IMPLANT
IV 0.9% NACL 1000 ML (IV SOLUTION) IMPLANT
KIT PINK PAD W/HEAD ARM REST (MISCELLANEOUS) ×2 IMPLANT
KIT TURNOVER KIT B (KITS) ×2 IMPLANT
LEGGING LITHOTOMY PAIR STRL (DRAPES) ×2 IMPLANT
MANIFOLD NEPTUNE II (INSTRUMENTS) IMPLANT
NEEDLE INSUFFLATION 14GA 120MM (NEEDLE) IMPLANT
OBTURATOR OPTICALSTD 8 DVNC (TROCAR) ×2 IMPLANT
OCCLUDER COLPOPNEUMO (BALLOONS) IMPLANT
PACK ROBOT WH (CUSTOM PROCEDURE TRAY) ×2 IMPLANT
PAD OB MATERNITY 11 LF (PERSONAL CARE ITEMS) ×2 IMPLANT
POWDER SURGICEL 3.0 GRAM (HEMOSTASIS) IMPLANT
RUMI II GYRUS 2.5CM BLUE (DISPOSABLE) IMPLANT
SCISSORS MNPLR CVD DVNC XI (INSTRUMENTS) ×2 IMPLANT
SEAL UNIV 5-12 XI (MISCELLANEOUS) ×8 IMPLANT
SEALER VESSEL EXT DVNC XI (MISCELLANEOUS) ×2 IMPLANT
SET CYSTO IRRIGATION (SET/KITS/TRAYS/PACK) ×2 IMPLANT
SET TRI-LUMEN FLTR TB AIRSEAL (TUBING) ×2 IMPLANT
SOLN 0.9% NACL POUR BTL 1000ML (IV SOLUTION) ×2 IMPLANT
SUT VIC AB 0 CT1 27XBRD ANBCTR (SUTURE) IMPLANT
SUT VIC AB 4-0 PS2 18 (SUTURE) ×4 IMPLANT
SUT VLOC 180 0 9IN GS21 (SUTURE) ×2 IMPLANT
SYSTEM BAG RETRIEVAL 10MM (BASKET) IMPLANT
TIP ENDOSCOPIC SURGICEL (TIP) IMPLANT
TIP RUMI ORANGE 6.7MMX12CM (TIP) IMPLANT
TOWEL GREEN STERILE (TOWEL DISPOSABLE) ×2 IMPLANT
TRAY FOLEY W/BAG SLVR 14FR (SET/KITS/TRAYS/PACK) ×2 IMPLANT
TROCAR PORT AIRSEAL 8X120 (TROCAR) ×2 IMPLANT
UNDERPAD 30X36 HEAVY ABSORB (UNDERPADS AND DIAPERS) ×2 IMPLANT

## 2024-06-18 NOTE — Interval H&P Note (Signed)
 History and Physical Interval Note:  06/18/2024 1:37 PM  Candice Holder  has presented today for surgery, with the diagnosis of aub.  The various methods of treatment have been discussed with the patient and family. After consideration of risks, benefits and other options for treatment, the patient has consented to  Procedures: CYSTOSCOPY (N/A) HYSTERECTOMY, TOTAL, ROBOT-ASSISTED, LAPAROSCOPIC, WITH BILATERAL SALPINGECTOMY as a surgical intervention.  The patient's history has been reviewed, patient examined, no change in status, stable for surgery.  I have reviewed the patient's chart and labs.  Questions were answered to the patient's satisfaction.     [name]

## 2024-06-18 NOTE — Anesthesia Procedure Notes (Signed)
 Procedure Name: Intubation Date/Time: 06/18/2024 2:29 PM  Performed by: Theophilus Burnet, Aloysius Pour, CRNAPre-anesthesia Checklist: Patient identified, Emergency Drugs available, Suction available and Patient being monitored Patient Re-evaluated:Patient Re-evaluated prior to induction Oxygen Delivery Method: Circle system utilized Preoxygenation: Pre-oxygenation with 100% oxygen Induction Type: IV induction Ventilation: Mask ventilation without difficulty Laryngoscope Size: Mac and 3 Grade View: Grade I Tube type: Oral Tube size: 7.0 mm Number of attempts: 1 Airway Equipment and Method: Stylet and Patient positioned with wedge pillow Placement Confirmation: ETT inserted through vocal cords under direct vision, positive ETCO2 and breath sounds checked- equal and bilateral Secured at: 22 cm Tube secured with: Tape Dental Injury: Teeth and Oropharynx as per pre-operative assessment

## 2024-06-18 NOTE — Discharge Instructions (Addendum)
 Post Op Hysterectomy Instructions Please read the instructions below. Refer to these instructions for the next few weeks. These instructions provide you with general information on caring for yourself after surgery. Your caregiver may also give you specific instructions. While your treatment has been planned according to the most current medical practices available, unavoidable problems sometimes happen. If you have any problems or questions after you leave, please call your caregiver.  Start blood thinner injections 06/19/24  HOME CARE INSTRUCTIONS Healing will take time. You will have discomfort, tenderness, swelling and bruising at the operative site for a couple of weeks. This is normal and will get better as time goes on.  Only take over-the-counter or prescription medicines for pain, discomfort or fever as directed by your caregiver.  Do not take aspirin. It can cause bleeding.  Do not drive when taking pain medication.  Follow your caregivers advice regarding diet, exercise, lifting, driving and general activities.  Resume your usual diet as directed and allowed.  Get plenty of rest and sleep.  Do not douche, use tampons, or have sexual intercourse until your caregiver gives you permission. .  Take your temperature if you feel hot or flushed.  You may shower today when you get home.  No tub bath for one week.   Do not drink alcohol until you are not taking any narcotic pain medications.  Try to have someone home with you for a week or two to help with the household activities.   Be careful over the next two to three weeks with any activities at home that involve lifting, pushing, or pulling.  Listen to your body--if something feels uncomfortable to do, then don't do it. Make sure you and your family understands everything about your operation and recovery.  Walking up stairs is fine. Do not sign any legal documents until you feel normal again.  Keep all your follow-up appointments as  recommended by your caregiver.   PLEASE CALL THE OFFICE IF: There is swelling, redness or increasing pain in the wound area.  Pus is coming from the wound.  You notice a bad smell from the wound or surgical dressing.  You have pain, redness and swelling from the intravenous site.  The wound is breaking open (the edges are not staying together).   You develop pain or bleeding when you urinate.  You develop abnormal vaginal discharge.  You have any type of abnormal reaction or develop an allergy to your medication.  You need stronger pain medication for your pain   SEEK IMMEDIATE MEDICAL CARE: You develop a temperature of 100.5 or higher.  You develop abdominal pain.  You develop chest pain.  You develop shortness of breath.  You pass out.  You develop pain, swelling or redness of your leg.  You develop heavy vaginal bleeding with or without blood clots.   MEDICATIONS: Restart your regular medications BUT wait one week before restarting all vitamins and mineral supplements Use Motrin  800mg  every 8 hours for the next several days.   Take Tylenol  1000mg  every 8 hours for the next several days Use oxycodone  5 mg every 4-6 hours. Taking motrin  and tylenol  should help reduce how often you use oxycodone .  You can take gabapentin  every 8 hours as needed. Do not take oxycodone  and gabapentin  at the same time.  You may use an over the counter stool softener like Colace or Dulcolax to help with starting a bowel movement.  You can also use miralax (polyethylene glycol). Start the day after  you go home.  Warm liquids, fluids, and ambulation help too.  If you have not had a bowel movement in four days, you need to call the office.

## 2024-06-18 NOTE — Op Note (Signed)
 Candice Holder PROCEDURE DATE: 06/18/2024  PREOPERATIVE DIAGNOSIS: abnormal uterine bleeding  POSTOPERATIVE DIAGNOSIS: abnormal uterine bleeding PROCEDURE:    robotic-assisted total laparoscopic hysterectomy, bilateral salpingectomy, lysis of adhesions, cystoscopy SURGEON: Vyncent Overby, MD ASSISTANT:  Jorene Moats, PA  An experienced assistant was required given the standard of surgical care given the complexity of the case.  This assistant was needed for exposure, dissection, suctioning, retraction, instrument exchange, and for overall help during the procedure.  INDICATIONS: 39 y.o. [id number] with AUB.  Risks of surgery were discussed with the patient including but not limited to: bleeding which may require transfusion; infection which may require antibiotics; injury to surrounding organs; need for additional procedures including laparotomy;  and other postoperative/anesthesia complications. Written informed consent was obtained.    FINDINGS:  Normal external genitalia, 10 wk size uterus with Normal contours and drawn by presumed intraabdominal adhesions.  Laparoscopically: normal upper abdominal survey, normal sized uterus with anterior adhesion to the anterior abdominal wall, grossly normal fallopian tubes with adhesions to the ovaries and displaced laterally by the ovary with adhesions to the abdominal wall, bilateral ureters seen, obliterated anterior cul de sac, normal posterior cul de sac Cystoscopically: normal bladder wall without apparent injury, bilateral ureteral orifices, fluorescein -stained urine from bilateral ureteral orifices   ANESTHESIA: General, general and TAP block INTRAVENOUS FLUIDS:  1500 ml of LR ESTIMATED BLOOD LOSS:  100 ML URINE OUTPUT: 500 ml SPECIMENS: uterus, cervix, bilateral fallopian tubes COMPLICATIONS:  None immediate.   PROCEDURE: The risks, benefits, and alternatives of surgery were explained, understood, and accepted. Consents were signed. All questions  were answered. She was taken to the operating room and general anesthesia was applied without complication. She was placed in the dorsal lithotomy position and her abdomen and vagina were prepped and draped after she had been carefully positioned on the table. A bimanual exam revealed a uterus with limited mobility and very anterior cervix. Her adnexa were not enlarged. A Foley catheter was placed and it drained clear throughout the case. A speculum was placed and the cervix visualized. The cervix was measured and the uterus was sounded to 12 cm. A Rumi uterine manipulator was placed, using a 12 cm tip and 2.5cm cup.  Gloves were changed and attention was turned to the abdomen. An 8mm incision was made in the LUQ and an optiview airseal trocar was introduced into the abdomen. The Entry was confirmed with low opening intraabdominal pressure and visualization and the abdomen was then insufflated. After good pneumoperitoneum was established, the abdomen was surveyed including the upper abdomen. She was placed in Trendelenburg position and ports were placed in appropriate positions on her abdomen to allow maximum exposure during the robotic case. Specifically, trocars were placed in the umbilicus, in the LLQ, RLQ, and RUQ under direct laparoscopic visualization. The robot was docked and I proceeded with a robotic portion of the case.  The pelvis was inspected and the uterus and was found to have significant adhesions.  The ureters and the infundibulopelvic ligaments were identified. The left fallopian tube attachment to the uterus was divided.  The left utero-ovarian ligament was cauterized and cut. The left round ligament was identified, cauterized and ligated. The posterior leaf of the broad ligament was divided down to the level of the cervical cup. The uterine vessels were identified and the tissue anterior to the vessels bluntly dissected and the vessels cauterized on the lower uterine segment to control the  blood supply to the uterus. Attention was turned to the contralateral side  where the same was carried out. The anterior abdominal wall adhesion was taken down with blunt and sharp dissection initially. Clear adhesions laterally were taken down sharply. The bladder was then backfilled and the bladder wall delineated. The remaining anterior adhesion was taken down carefully with minimal use of cautery. Once the anterior adhesion was taken down and the lower uterine segment visualized, the bladder flap was completed by dissecting down to the pubocervical fascia following drainage of the bladder. The uterine vessels were further skeletonized, cauterized, divided and lateralized to the cup edge. The colpotomy incision was extended circumferentially, following the blue outline of the Rumi manipulator. All pedicles were hemostatic.  The uterus was removed from the vagina. Attention turned to the right adnexa where the ovary was sharply and bluntly dissected away from the abdominal wall and the fallopian tube visualized. The salpingectomy was completed and the fallopian tube removed. The same was carried out on the contralateral side. The vaginal cuff was closed with 0 v-loc suture in a running fashion.  Excellent hemostasis was noted throughout. The pelvis was irrigated. The intraabdominal pressure was lowered to assess hemostasis. After determining excellent hemostasis, the robot was undocked. At this point I performed cystoscopy. The cystoscopy revealed fluorescein -stained urine ejection from both ureters. Attention returned intra-abdominally and surgical powder applied to all of the surgical sites. The skin from all of the other ports was closed with 4-0 vicryl. The patient was then extubated and taken to recovery in stable condition.   Sponge, lap and needle counts were correct x 2.    Carter Quarry, MD Minimally Invasive Gynecologic Surgery  Obstetrics and Gynecology, [facility] for [facility], [facility] 06/18/2024

## 2024-06-18 NOTE — Brief Op Note (Signed)
 06/18/2024  1:24 PM  5:35 PM  PATIENT:  [name]  39 y.o. female  PRE-OPERATIVE DIAGNOSIS:  aub  POST-OPERATIVE DIAGNOSIS:  aub  PROCEDURE:  Procedures: CYSTOSCOPY (N/A) HYSTERECTOMY, TOTAL, ROBOT-ASSISTED, LAPAROSCOPIC, WITH BILATERAL SALPINGECTOMY LYSIS, ADHESIONS, ROBOT-ASSISTED, LAPAROSCOPIC  SURGEON:  Surgeons and Role:    DEWAINE Jeralyn Crutch, MD - Primary  PHYSICIAN ASSISTANT: Jorene Moats, PA  ASSISTANTS: above   ANESTHESIA:   general and TAP block  EBL:  100 mL   BLOOD ADMINISTERED:none  DRAINS: none   LOCAL MEDICATIONS USED:  BUPIVICAINE  and OTHER EXPAREL   SPECIMEN:  Source of Specimen:  UTERUS, CERVIX, BILATERAL FALLOPIAN TUBES  DISPOSITION OF SPECIMEN:  PATHOLOGY  COUNTS:  YES  TOURNIQUET:  * No tourniquets in log *  DICTATION: .Note written in EPIC  PLAN OF CARE: Discharge to home after PACU  PATIENT DISPOSITION:  PACU - hemodynamically stable.   Delay start of Pharmacological VTE agent (>24hrs) due to surgical blood loss or risk of bleeding: no

## 2024-06-18 NOTE — Anesthesia Preprocedure Evaluation (Signed)
"                                    Anesthesia Evaluation  Patient identified by MRN, date of birth, ID band Patient awake    Reviewed: Allergy & Precautions, NPO status , Patient's Chart, lab work & pertinent test results  Airway Mallampati: II  TM Distance: >3 FB Neck ROM: Full    Dental  (+) Edentulous Upper, Dental Advisory Given   Pulmonary neg pulmonary ROS   breath sounds clear to auscultation       Cardiovascular hypertension, Pt. on home beta blockers and Pt. on medications  Rhythm:Regular Rate:Normal     Neuro/Psych  PSYCHIATRIC DISORDERS Anxiety Depression Bipolar Disorder   negative neurological ROS     GI/Hepatic negative GI ROS, Neg liver ROS,,,  Endo/Other  negative endocrine ROS    Renal/GU negative Renal ROS     Musculoskeletal negative musculoskeletal ROS (+)    Abdominal   Peds  Hematology negative hematology ROS (+)   Anesthesia Other Findings   Reproductive/Obstetrics                              Anesthesia Physical Anesthesia Plan  ASA: 3  Anesthesia Plan: General   Post-op Pain Management: Tylenol  PO (pre-op)*, Toradol  IV (intra-op)*, Gabapentin  PO (pre-op)* and Regional block*   Induction: Intravenous  PONV Risk Score and Plan: 4 or greater and Ondansetron , Dexamethasone , Midazolam  and Scopolamine patch - Pre-op  Airway Management Planned: Oral ETT  Additional Equipment: None  Intra-op Plan:   Post-operative Plan: Extubation in OR  Informed Consent: I have reviewed the patients History and Physical, chart, labs and discussed the procedure including the risks, benefits and alternatives for the proposed anesthesia with the patient or authorized representative who has indicated his/her understanding and acceptance.     Dental advisory given  Plan Discussed with: CRNA  Anesthesia Plan Comments:         Anesthesia Quick Evaluation  "

## 2024-06-18 NOTE — Transfer of Care (Signed)
 Immediate Anesthesia Transfer of Care Note  Patient: Candice Holder  Procedure(s) Performed: CYSTOSCOPY HYSTERECTOMY, TOTAL, ROBOT-ASSISTED, LAPAROSCOPIC, WITH BILATERAL SALPINGECTOMY LYSIS, ADHESIONS, ROBOT-ASSISTED, LAPAROSCOPIC  Patient Location: PACU  Anesthesia Type:General  Level of Consciousness: awake  Airway & Oxygen Therapy: Patient Spontanous Breathing and Patient connected to face mask oxygen  Post-op Assessment: Report given to RN  Post vital signs: stable  Last Vitals:  Vitals Value Taken Time  BP 132/93 06/18/24 17:51  Temp    Pulse 80 06/18/24 17:58  Resp 15 06/18/24 17:58  SpO2 98 % 06/18/24 17:58  Vitals shown include unfiled device data.  Last Pain:  Vitals:   06/18/24 1209  TempSrc:   PainSc: 0-No pain      Patients Stated Pain Goal: 5 (06/18/24 1206)  Complications: There were no known notable events for this encounter.

## 2024-06-18 NOTE — Anesthesia Procedure Notes (Signed)
 Anesthesia Regional Block: TAP block   Pre-Anesthetic Checklist: , timeout performed,  Correct Patient, Correct Site, Correct Laterality,  Correct Procedure, Correct Position, site marked,  Risks and benefits discussed,  Surgical consent,  Pre-op evaluation,  At surgeon's request and post-op pain management  Laterality: Right  Prep: chloraprep       Needles:  Injection technique: Single-shot  Needle Type: Echogenic Stimulator Needle     Needle Length: 9cm  Needle Gauge: 21     Additional Needles:   Procedures:,,,, ultrasound used (permanent image in chart),,    Narrative:  Start time: 06/18/2024 2:35 PM End time: 06/18/2024 2:40 PM Injection made incrementally with aspirations every 5 mL.  Performed by: Personally  Anesthesiologist: Tilford Franky BIRCH, MD  Additional Notes: Discussed risks and benefits of the nerve block in detail, including but not limited vascular injury, permanent nerve damage and infection.   Patient tolerated the procedure well. Local anesthetic introduced in an incremental fashion under minimal resistance after negative aspirations. No paresthesias were elicited. After completion of the procedure, no acute issues were identified and patient continued to be monitored by RN.

## 2024-06-18 NOTE — Anesthesia Procedure Notes (Signed)
 Anesthesia Regional Block: TAP block   Pre-Anesthetic Checklist: , timeout performed,  Correct Patient, Correct Site, Correct Laterality,  Correct Procedure, Correct Position, site marked,  Risks and benefits discussed,  Surgical consent,  Pre-op evaluation,  At surgeon's request and post-op pain management  Laterality: Left  Prep: chloraprep       Needles:  Injection technique: Single-shot  Needle Type: Echogenic Stimulator Needle     Needle Length: 9cm  Needle Gauge: 21     Additional Needles:   Procedures:,,,, ultrasound used (permanent image in chart),,    Narrative:  Start time: 06/18/2024 2:30 PM End time: 06/18/2024 2:35 PM Injection made incrementally with aspirations every 5 mL.  Performed by: Personally  Anesthesiologist: Tilford Franky BIRCH, MD  Additional Notes: Discussed risks and benefits of the nerve block in detail, including but not limited vascular injury, permanent nerve damage and infection.   Patient tolerated the procedure well. Local anesthetic introduced in an incremental fashion under minimal resistance after negative aspirations. No paresthesias were elicited. After completion of the procedure, no acute issues were identified and patient continued to be monitored by RN.

## 2024-06-19 ENCOUNTER — Encounter (HOSPITAL_COMMUNITY): Payer: Self-pay | Admitting: Obstetrics and Gynecology

## 2024-06-19 MED FILL — Heparin Sodium (Porcine) Inj 1000 Unit/ML: INTRAMUSCULAR | Qty: 10 | Status: AC

## 2024-06-19 MED FILL — Cefazolin Sodium-Dextrose IV Solution 2 GM/100ML-4%: INTRAVENOUS | Qty: 100 | Status: AC

## 2024-06-19 MED FILL — Cefazolin Sodium for IV Soln 1 GM and Dextrose 4% (50 ML): INTRAVENOUS | Qty: 50 | Status: AC

## 2024-06-19 NOTE — Telephone Encounter (Signed)
 HST completed 06/04/24. NFN.

## 2024-06-19 NOTE — Anesthesia Postprocedure Evaluation (Signed)
"   Anesthesia Post Note  Patient: Candice Holder  Procedure(s) Performed: CYSTOSCOPY HYSTERECTOMY, TOTAL, ROBOT-ASSISTED, LAPAROSCOPIC, WITH BILATERAL SALPINGECTOMY LYSIS, ADHESIONS, ROBOT-ASSISTED, LAPAROSCOPIC     Patient location during evaluation: PACU Anesthesia Type: General Level of consciousness: awake and alert Pain management: pain level controlled Vital Signs Assessment: post-procedure vital signs reviewed and stable Respiratory status: spontaneous breathing, nonlabored ventilation, respiratory function stable and patient connected to nasal cannula oxygen Cardiovascular status: blood pressure returned to baseline and stable Postop Assessment: no apparent nausea or vomiting Anesthetic complications: no   There were no known notable events for this encounter.  Last Vitals:  Vitals:   06/18/24 1830 06/18/24 1845  BP: (!) 161/106 133/78  Pulse: 78 80  Resp: (!) 29 (!) 25  Temp:  36.5 C  SpO2: 94% 94%    Last Pain:  Vitals:   06/18/24 1845  TempSrc:   PainSc: 0-No pain   Pain Goal: Patients Stated Pain Goal: 5 (06/18/24 1206)                 [name]      "

## 2024-06-21 ENCOUNTER — Telehealth: Admitting: Adult Health

## 2024-06-21 ENCOUNTER — Encounter: Payer: Self-pay | Admitting: Adult Health

## 2024-06-21 DIAGNOSIS — I10 Essential (primary) hypertension: Secondary | ICD-10-CM | POA: Diagnosis not present

## 2024-06-21 DIAGNOSIS — G4733 Obstructive sleep apnea (adult) (pediatric): Secondary | ICD-10-CM | POA: Diagnosis not present

## 2024-06-21 DIAGNOSIS — Z6841 Body Mass Index (BMI) 40.0 and over, adult: Secondary | ICD-10-CM

## 2024-06-21 LAB — SURGICAL PATHOLOGY

## 2024-06-21 NOTE — Patient Instructions (Addendum)
 Begin CPAP therapy at bedtime and with naps, wear all night long for at least 6 or more hours Healthy sleep regimen Work on healthy weight loss Do not drive if sleepy Use caution with sedating medications Follow-up in 2 to 3 months with Dr. Olena or Tylee Newby NP and as needed

## 2024-06-21 NOTE — Progress Notes (Signed)
 Virtual Visit via Video Note  I connected with Candice Holder on 06/21/2024 at  1:30 PM EST by a video enabled telemedicine application and verified that I am speaking with the correct person using two identifiers.  Location: Patient: Home  Provider: Office    I discussed the limitations of evaluation and management by telemedicine and the availability of in person appointments. The patient expressed understanding and agreed to proceed.  History of Present Illness: 39 year old female seen for sleep consult November 2024 found to have severe obstructive sleep apnea Patient is here today for a virtual visit to discuss sleep study results.  Patient was seen in November 2024 for snoring, restless sleep and daytime sleepiness she was set up for a home sleep study, unfortunately was delayed due to insurance coverage.  She completed her home sleep study on June 05, 2024 that showed severe sleep apnea with AHI 51.7/hour and SpO2 low at 84%.  We have discussed her sleep study results in detail.  We went over treatment options including weight loss, oral appliance, positional sleep, CPAP therapy.  Due to patient's significant symptom burden and severity of sleep apnea we we will proceed with CPAP therapy.  Patient education was given on sleep apnea and CPAP care.   Observations/Objective: Home sleep study June 05, 2024 severe sleep apnea AHI 51.7/hour and SpO2 low to 84%  Assessment and Plan: Severe obstructive sleep apnea with significant symptom burden-we discussed her sleep study results in detail went over treatment options including weight loss, oral appliance, positional sleep and CPAP therapy.  Patient has significant symptom burden and underlying comorbidities including hypertension and morbid obesity.  Recommend she begin CPAP therapy.  Patient education on CPAP given.  - discussed how weight can impact sleep and risk for sleep disordered breathing - discussed options to assist with weight  loss: combination of diet modification, cardiovascular and strength training exercises   - had an extensive discussion regarding the adverse health consequences related to untreated sleep disordered breathing - specifically discussed the risks for hypertension, coronary artery disease, cardiac dysrhythmias, cerebrovascular disease, and diabetes - lifestyle modification discussed   - discussed how sleep disruption can increase risk of accidents, particularly when driving - safe driving practices were discussed   Begin auto CPAP 5 to 15 cm H2O.  Morbid obesity with BMI 50.  Continue with healthy weight management  Hypertension-continue on current regimen follow-up primary care  Plan  Patient Instructions  Begin CPAP therapy at bedtime and with naps, wear all night long for at least 6 or more hours Healthy sleep regimen Work on healthy weight loss Do not drive if sleepy Follow-up in 2 to 3 months with Dr. Olena or Daeton Kluth NP and as needed     Follow Up Instructions:    I discussed the assessment and treatment plan with the patient. The patient was provided an opportunity to ask questions and all were answered. The patient agreed with the plan and demonstrated an understanding of the instructions.   The patient was advised to call back or seek an in-person evaluation if the symptoms worsen or if the condition fails to improve as anticipated.  I provided   30 minutes of non-face-to-face time during this encounter.   Madelin Stank, NP

## 2024-06-26 ENCOUNTER — Ambulatory Visit: Payer: Self-pay | Admitting: Obstetrics and Gynecology

## 2024-07-10 ENCOUNTER — Ambulatory Visit: Payer: Self-pay | Admitting: Obstetrics and Gynecology

## 2024-07-10 ENCOUNTER — Other Ambulatory Visit: Payer: Self-pay

## 2024-07-10 ENCOUNTER — Encounter: Payer: Self-pay | Admitting: Obstetrics and Gynecology

## 2024-07-10 VITALS — BP 122/95 | HR 77 | Wt 281.9 lb

## 2024-07-10 DIAGNOSIS — Z09 Encounter for follow-up examination after completed treatment for conditions other than malignant neoplasm: Secondary | ICD-10-CM

## 2024-07-10 DIAGNOSIS — N939 Abnormal uterine and vaginal bleeding, unspecified: Secondary | ICD-10-CM

## 2024-07-10 NOTE — Progress Notes (Signed)
" ° °  POSTOPERATIVE VISIT NOTE   Subjective:     Makailah Slavick is a 39 y.o. [id number] who presents to the clinic 3 weeks status post robotic assisted total laparoscopic hysterectomy for abnormal uterine bleeding. Eating a regular diet without difficulty. Bowel movements are normal. Pain is controlled with current analgesics. Medications being used: acetaminophen . Incision: doing ok - one of them is bleeding every few days, the remainder are doing one - lthe LUQ incision is the least cooperative Vaginal bleeding: none Resumed sexual acitivity: no    Was nauseaous a few days last week and lightheaded, but thinks she may have been overexerting herself, sister got her son which seemed to help and thinks had a stomach bug and was down for about 3-4 days; was having diarrhea  Changed medications about 1 week ago Oxycodone  was making her nauseouas Finished lovenox   The following portions of the patient's history were reviewed and updated as appropriate: allergies, current medications, past family history, past medical history, past social history, past surgical history, and problem list..   Review of Systems Pertinent items are noted in HPI.    Objective:    BP (!) 130/99   Pulse 73   Wt 281 lb 14.4 oz (127.9 kg)   BMI 49.15 kg/m  General:  alert, cooperative, and no distress  Abdomen: soft, bowel sounds active, non-tender  Incision:   healing well, no drainage, no erythema, no hernia, no seroma, no swelling, LUQ incision with small suture that was removed, no dehiscence, incision well approximated  Pelvic:   Exam deferred.    Pathology Results: FINAL MICROSCOPIC DIAGNOSIS:   A. UTERUS AND CERVIX, WITH BILATERAL FALLOPIAN TUBES, HYSTERECTOMY:  - Benign endometrial polyp with background weakly proliferative  endometrium.  - Myometrium with no specific pathologic change.  - Cervix with benign squamous metaplasia.  - Fallopian tubes with benign paratubal cysts.    Assessment:   Doing well  postoperatively. Operative findings again reviewed. Pathology report discussed.   Plan:    1. Postop check (Primary) Doing well Reviewed intra-op photos and pathology Reviewed postoperative expectations and recovery including slow return to activity and continued pelvic rest  2. Abnormal uterine bleeding (AUB) Resolved s/p hysterectomy   Activity restrictions: no lifting more than 10 pounds   Carter Quarry, MD Obstetrician & Gynecologist, [facility] for [employer], [facility] "

## 2024-07-11 NOTE — Telephone Encounter (Signed)
 Medication Access Center Summary  PA Status Denied  Medication Betamethasone, Augmented 0.05% lotion  Reason for Denial [place]  Medicaid does not cover the following service(s) in the [facility] Plan:  Beta Diprop Lot 0.05% The requested drug is being used for hair loss. Drugs used for this reason are not covered under your plan. Please speak with your doctor about your choices.   Insurance Company [facility] Medicaid  Provider [place]   Denial letter saved to Media tab     Suzen LOISE [facility]

## 2024-07-26 ENCOUNTER — Other Ambulatory Visit: Payer: Medicaid Other

## 2024-07-26 ENCOUNTER — Ambulatory Visit: Payer: Medicaid Other | Admitting: Oncology

## 2024-08-15 ENCOUNTER — Ambulatory Visit: Payer: Self-pay | Admitting: Obstetrics and Gynecology

## 2025-05-19 ENCOUNTER — Inpatient Hospital Stay

## 2025-05-19 ENCOUNTER — Inpatient Hospital Stay: Admitting: Oncology
